# Patient Record
Sex: Female | Born: 1970 | Race: White | Hispanic: No | Marital: Married | Smoking: Current every day smoker
Health system: Southern US, Community
[De-identification: ages and names within clinical notes are randomized; demographics above are authoritative.]

## PROBLEM LIST (undated history)

## (undated) DIAGNOSIS — F419 Anxiety disorder, unspecified: Secondary | ICD-10-CM

## (undated) DIAGNOSIS — R569 Unspecified convulsions: Secondary | ICD-10-CM

## (undated) HISTORY — DX: Anxiety disorder, unspecified: F41.9

---

## 1999-11-12 ENCOUNTER — Inpatient Hospital Stay (HOSPITAL_COMMUNITY): Admission: EM | Admit: 1999-11-12 | Discharge: 1999-11-13 | Payer: Self-pay | Admitting: *Deleted

## 1999-11-12 ENCOUNTER — Encounter: Payer: Self-pay | Admitting: *Deleted

## 2001-03-11 ENCOUNTER — Emergency Department (HOSPITAL_COMMUNITY): Admission: EM | Admit: 2001-03-11 | Discharge: 2001-03-11 | Payer: Self-pay | Admitting: Emergency Medicine

## 2001-03-11 ENCOUNTER — Encounter: Payer: Self-pay | Admitting: Emergency Medicine

## 2001-05-04 ENCOUNTER — Ambulatory Visit (HOSPITAL_COMMUNITY): Admission: RE | Admit: 2001-05-04 | Discharge: 2001-05-04 | Payer: Self-pay | Admitting: *Deleted

## 2001-07-29 ENCOUNTER — Ambulatory Visit (HOSPITAL_COMMUNITY): Admission: RE | Admit: 2001-07-29 | Discharge: 2001-07-29 | Payer: Self-pay | Admitting: *Deleted

## 2001-08-26 ENCOUNTER — Ambulatory Visit (HOSPITAL_COMMUNITY): Admission: RE | Admit: 2001-08-26 | Discharge: 2001-08-26 | Payer: Self-pay | Admitting: *Deleted

## 2001-10-04 ENCOUNTER — Encounter (HOSPITAL_COMMUNITY): Admission: AD | Admit: 2001-10-04 | Discharge: 2001-10-04 | Payer: Self-pay | Admitting: *Deleted

## 2001-10-11 ENCOUNTER — Inpatient Hospital Stay (HOSPITAL_COMMUNITY): Admission: AD | Admit: 2001-10-11 | Discharge: 2001-10-14 | Payer: Self-pay | Admitting: *Deleted

## 2005-12-09 ENCOUNTER — Emergency Department (HOSPITAL_COMMUNITY): Admission: EM | Admit: 2005-12-09 | Discharge: 2005-12-10 | Payer: Self-pay | Admitting: Emergency Medicine

## 2013-06-09 ENCOUNTER — Encounter (HOSPITAL_COMMUNITY): Payer: Self-pay | Admitting: Emergency Medicine

## 2013-06-09 ENCOUNTER — Emergency Department (HOSPITAL_COMMUNITY): Payer: Self-pay

## 2013-06-09 ENCOUNTER — Emergency Department (HOSPITAL_COMMUNITY)
Admission: EM | Admit: 2013-06-09 | Discharge: 2013-06-09 | Disposition: A | Discharge status: Home / Self Care | Payer: Self-pay | Attending: Emergency Medicine | Admitting: Emergency Medicine

## 2013-06-09 DIAGNOSIS — Z880 Allergy status to penicillin: Secondary | ICD-10-CM | POA: Insufficient documentation

## 2013-06-09 DIAGNOSIS — N201 Calculus of ureter: Secondary | ICD-10-CM | POA: Insufficient documentation

## 2013-06-09 DIAGNOSIS — N898 Other specified noninflammatory disorders of vagina: Secondary | ICD-10-CM | POA: Insufficient documentation

## 2013-06-09 LAB — URINALYSIS, ROUTINE W REFLEX MICROSCOPIC
BILIRUBIN URINE: NEGATIVE
Glucose, UA: NEGATIVE mg/dL
KETONES UR: 15 mg/dL — AB
Leukocytes, UA: NEGATIVE
Nitrite: NEGATIVE
Protein, ur: NEGATIVE mg/dL
SPECIFIC GRAVITY, URINE: 1.025 (ref 1.005–1.030)
Urobilinogen, UA: 1 mg/dL (ref 0.0–1.0)
pH: 6.5 (ref 5.0–8.0)

## 2013-06-09 LAB — CBC WITH DIFFERENTIAL/PLATELET
Basophils Absolute: 0 10*3/uL (ref 0.0–0.1)
Basophils Relative: 0 % (ref 0–1)
EOS ABS: 0.2 10*3/uL (ref 0.0–0.7)
EOS PCT: 3 % (ref 0–5)
HCT: 41.5 % (ref 36.0–46.0)
HEMOGLOBIN: 14.2 g/dL (ref 12.0–15.0)
LYMPHS ABS: 1.7 10*3/uL (ref 0.7–4.0)
Lymphocytes Relative: 20 % (ref 12–46)
MCH: 31.3 pg (ref 26.0–34.0)
MCHC: 34.2 g/dL (ref 30.0–36.0)
MCV: 91.6 fL (ref 78.0–100.0)
MONOS PCT: 7 % (ref 3–12)
Monocytes Absolute: 0.6 10*3/uL (ref 0.1–1.0)
Neutro Abs: 6.1 10*3/uL (ref 1.7–7.7)
Neutrophils Relative %: 71 % (ref 43–77)
Platelets: 199 10*3/uL (ref 150–400)
RBC: 4.53 MIL/uL (ref 3.87–5.11)
RDW: 12.9 % (ref 11.5–15.5)
WBC: 8.6 10*3/uL (ref 4.0–10.5)

## 2013-06-09 LAB — URINE MICROSCOPIC-ADD ON

## 2013-06-09 LAB — WET PREP, GENITAL
Clue Cells Wet Prep HPF POC: NONE SEEN
TRICH WET PREP: NONE SEEN
Yeast Wet Prep HPF POC: NONE SEEN

## 2013-06-09 LAB — COMPREHENSIVE METABOLIC PANEL
ALT: 9 U/L (ref 0–35)
AST: 14 U/L (ref 0–37)
Albumin: 3.7 g/dL (ref 3.5–5.2)
Alkaline Phosphatase: 61 U/L (ref 39–117)
BUN: 12 mg/dL (ref 6–23)
CALCIUM: 8.9 mg/dL (ref 8.4–10.5)
CO2: 27 mEq/L (ref 19–32)
Chloride: 104 mEq/L (ref 96–112)
Creatinine, Ser: 0.58 mg/dL (ref 0.50–1.10)
GFR calc non Af Amer: >90 mL/min (ref >90)
GLUCOSE: 102 mg/dL — AB (ref 70–99)
Potassium: 4 mEq/L (ref 3.7–5.3)
Sodium: 143 mEq/L (ref 137–147)
TOTAL PROTEIN: 7 g/dL (ref 6.0–8.3)
Total Bilirubin: 0.5 mg/dL (ref 0.3–1.2)

## 2013-06-09 LAB — LIPASE, BLOOD: Lipase: 41 U/L (ref 11–59)

## 2013-06-09 LAB — HCG, SERUM, QUALITATIVE: PREG SERUM: NEGATIVE

## 2013-06-09 MED ORDER — KETOROLAC TROMETHAMINE 30 MG/ML IJ SOLN
30.0000 mg | Freq: Once | INTRAMUSCULAR | Status: AC
  Administered 2013-06-09: 30 mg via INTRAVENOUS
  Filled 2013-06-09: qty 1

## 2013-06-09 MED ORDER — ONDANSETRON HCL 4 MG/2ML IJ SOLN
4.0000 mg | Freq: Once | INTRAMUSCULAR | Status: AC
  Administered 2013-06-09: 4 mg via INTRAVENOUS
  Filled 2013-06-09: qty 2

## 2013-06-09 MED ORDER — AZITHROMYCIN 250 MG PO TABS
1000.0000 mg | ORAL_TABLET | Freq: Once | ORAL | Status: AC
  Administered 2013-06-09: 1000 mg via ORAL
  Filled 2013-06-09: qty 4

## 2013-06-09 MED ORDER — PROMETHAZINE HCL 25 MG PO TABS: 25.0000 mg | ORAL_TABLET | Freq: Four times a day (QID) | ORAL | Status: DC | PRN

## 2013-06-09 MED ORDER — CEFTRIAXONE SODIUM 250 MG IJ SOLR
250.0000 mg | Freq: Once | INTRAMUSCULAR | Status: AC
  Administered 2013-06-09: 250 mg via INTRAMUSCULAR
  Filled 2013-06-09: qty 250

## 2013-06-09 MED ORDER — HYDROMORPHONE HCL PF 1 MG/ML IJ SOLN
1.0000 mg | Freq: Once | INTRAMUSCULAR | Status: AC
  Administered 2013-06-09: 1 mg via INTRAVENOUS
  Filled 2013-06-09: qty 1

## 2013-06-09 MED ORDER — LORAZEPAM 2 MG/ML IJ SOLN
1.0000 mg | Freq: Once | INTRAMUSCULAR | Status: AC
  Administered 2013-06-09: 1 mg via INTRAVENOUS
  Filled 2013-06-09: qty 1

## 2013-06-09 MED ORDER — FENTANYL CITRATE 0.05 MG/ML IJ SOLN: 100.0000 ug | Freq: Once | INTRAMUSCULAR | Status: DC

## 2013-06-09 MED ORDER — OXYCODONE-ACETAMINOPHEN 5-325 MG PO TABS: 1.0000 | ORAL_TABLET | Freq: Four times a day (QID) | ORAL | Status: DC | PRN

## 2013-06-09 NOTE — ED Notes (Signed)
Patient transported from Ultrasound 

## 2013-06-09 NOTE — ED Notes (Signed)
Pt with acute onset RLQ pain.  Pt still has appendix.  Denies changes in bowel/bladder habits or vag discharge.

## 2013-06-09 NOTE — ED Provider Notes (Signed)
CSN: [id number]     Arrival date & time 06/09/13  0802 History   First MD Initiated Contact with Patient 06/09/13 [phone]     Chief Complaint  Patient presents with  . Abdominal Pain     (Consider location/radiation/quality/duration/timing/severity/associated sxs/prior Treatment) HPI Comments: Patient is a 43 year old female who presents today with severe, sudden onset right lower quadrant pain. She reports that the pain began at approximately 7 AM. It is a sharp pain that does not radiate. She ate breakfast prior to this pain beginning. She denies any nausea, vomiting, diarrhea, fevers, chills. She reports she has had an increase in clear vaginal discharge. She has had feelings of not emptying completely and urinary frequency. She denies any chance that she is pregnant. Last menstrual period was 05/22/2013. She has never had pain like this in the past. No prior hx of kidney stones.   Patient is a 43 y.o. female presenting with abdominal pain. The history is provided by the patient. No language interpreter was used.  Abdominal Pain Associated symptoms: vaginal discharge   Associated symptoms: no chest pain, no chills, no constipation, no diarrhea, no dysuria, no fever, no nausea, no shortness of breath, no vaginal bleeding and no vomiting     History reviewed. No pertinent past medical history. History reviewed. No pertinent past surgical history. No family history on file. History  Substance Use Topics  . Smoking status: Never Smoker   . Smokeless tobacco: Not on file  . Alcohol Use: No   OB History   Grav Para Term Preterm Abortions TAB SAB Ect Mult Living                 Review of Systems  Constitutional: Negative for fever and chills.  Respiratory: Negative for shortness of breath.   Cardiovascular: Negative for chest pain.  Gastrointestinal: Positive for abdominal pain. Negative for nausea, vomiting, diarrhea and constipation.  Genitourinary: Positive for frequency, decreased  urine volume, vaginal discharge and pelvic pain. Negative for dysuria, urgency and vaginal bleeding.  All other systems reviewed and are negative.      Allergies  Amoxicillin and Penicillins  Home Medications  No current outpatient prescriptions on file. BP 146/117  Pulse 85  Temp(Src) 98.4 F (36.9 C) (Oral)  Resp 18  SpO2 98%  LMP 05/22/2013 Physical Exam  Nursing note and vitals reviewed. Constitutional: She is oriented to person, place, and time. She appears well-developed and well-nourished. She does not appear ill. She appears distressed.  Patient is writhing around on bed, moaning. Patient is distractable.   HENT:  Head: Normocephalic and atraumatic.  Right Ear: External ear normal.  Left Ear: External ear normal.  Nose: Nose normal.  Mouth/Throat: Oropharynx is clear and moist.  Eyes: Conjunctivae are normal.  Neck: Normal range of motion.  Cardiovascular: Normal rate, regular rhythm and normal heart sounds.   Pulmonary/Chest: Effort normal and breath sounds normal. No stridor. No respiratory distress. She has no wheezes. She has no rales.  Abdominal: Soft. She exhibits no distension.    Genitourinary: There is no rash, tenderness, lesion or injury on the right labia. There is no rash, tenderness, lesion or injury on the left labia. Uterus is tender. Cervix exhibits no motion tenderness, no discharge and no friability. Right adnexum displays tenderness. Right adnexum displays no mass and no fullness. Left adnexum displays no mass, no tenderness and no fullness. No erythema, tenderness or bleeding around the vagina. No foreign body around the vagina. No signs of injury around  the vagina. No vaginal discharge found.  Musculoskeletal: Normal range of motion.  Neurological: She is alert and oriented to person, place, and time. She has normal strength.  Skin: Skin is warm and dry. She is not diaphoretic. No erythema.  Psychiatric: She has a normal mood and affect. Her  behavior is normal.    ED Course  Procedures (including critical care time) Labs Review Labs Reviewed  WET PREP, GENITAL - Abnormal; Notable for the following:    WBC, Wet Prep HPF POC TOO NUMEROUS TO COUNT (*)    All other components within normal limits  COMPREHENSIVE METABOLIC PANEL - Abnormal; Notable for the following:    Glucose, Bld 102 (*)    All other components within normal limits  URINALYSIS, ROUTINE W REFLEX MICROSCOPIC - Abnormal; Notable for the following:    APPearance CLOUDY (*)    Hgb urine dipstick LARGE (*)    Ketones, ur 15 (*)    All other components within normal limits  URINE MICROSCOPIC-ADD ON - Abnormal; Notable for the following:    Squamous Epithelial / LPF FEW (*)    All other components within normal limits  URINE CULTURE  GC/CHLAMYDIA PROBE AMP  CBC WITH DIFFERENTIAL  LIPASE, BLOOD  HCG, SERUM, QUALITATIVE   Imaging Review Ct Abdomen Pelvis Wo Contrast  06/09/2013   CLINICAL DATA:  Flank pain.  EXAM: CT ABDOMEN AND PELVIS WITHOUT CONTRAST  TECHNIQUE: Multidetector CT imaging of the abdomen and pelvis was performed following the standard protocol without intravenous contrast.  COMPARISON:  US PELVIS COMPLETE dated 06/09/2013  FINDINGS: Dependent subsegmental atelectasis in the lower lobes.  The patient moved throughout imaging, reducing diagnostic sensitivity and specificity. The patient was not able to position her arms above her head, and the limbs introduce some streak artifact.  Accounting for these factors, the liver, spleen, pancreas, and adrenal glands appear unremarkable on noncontrast imaging.  There is right hydronephrosis, perirenal stranding, and proximal hydroureter. There is likely distal hydroureter. There are some calcifications in the pelvis in the patient could well have a distal ureteral calculus, but images through the pelvis are severely blurred by motion artifact. Other causes of ureteral obstruction are not excluded.  The urinary  bladder is empty. Left kidney unremarkable. Segments of the appendix are thought to be visualized and appear unremarkable.  IMPRESSION: 1. Considerable right hydronephrosis and hydroureter extending down into the pelvis. There may well be a distal ureteral calculus, but the pelvis is obscured by severe motion artifact. The patient was not able to cooperate with imaging and there is extensive motion artifact on today's exam. 2. Segments of the appendix are visualized and believed to be normal. 3. Dependent subsegmental atelectasis in the lung bases.   Electronically Signed   By: Herbie Baltimore M.D.   On: 06/09/2013 13:31   US Transvaginal Non-ob  06/09/2013   CLINICAL DATA:  Acute right lower quadrant pain  EXAM: TRANSABDOMINAL AND TRANSVAGINAL ULTRASOUND OF PELVIS  DOPPLER ULTRASOUND OF OVARIES  TECHNIQUE: Both transabdominal and transvaginal ultrasound examinations of the pelvis were performed. Transabdominal technique was performed for global imaging of the pelvis including uterus, ovaries, adnexal regions, and pelvic cul-de-sac. Examination limited by patient motion. Patient unable to provide additional history.  It was necessary to proceed with endovaginal exam following the transabdominal exam to visualize the ovaries and endometrium. Color and duplex Doppler ultrasound was utilized to evaluate blood flow to the ovaries.  COMPARISON:  None  FINDINGS: Uterus  Measurements: 9.5 x 4.8 x  6.9 cm. Normal morphology without mass.  Endometrium  Thickness: 8 mm thick, normal. No endometrial fluid or focal abnormality.  Right ovary  Measurements: 2.5 x 1.5 x 1.2 cm. Normal morphology without mass. Internal blood flow present on color Doppler imaging.  Left ovary  Measurements: 3.1 x 2.3 x 2.7 cm. Dominant follicle cyst 1.8 x 1.5 x 1.8 cm. No additional mass. Internal blood flow present on color Doppler imaging.  Pulsed Doppler evaluation of both ovaries demonstrates normal low-resistance arterial and venous  waveforms.  Other findings  No free pelvic fluid. No adnexal masses. Appendix was not visualized nor expected to be visualized on this pelvic ultrasound exam.  IMPRESSION: No acute abnormalities.   Electronically Signed   By: Ulyses Southward M.D.   On: 06/09/2013 11:55   US Pelvis Complete  06/09/2013   CLINICAL DATA:  Acute right lower quadrant pain  EXAM: TRANSABDOMINAL AND TRANSVAGINAL ULTRASOUND OF PELVIS  DOPPLER ULTRASOUND OF OVARIES  TECHNIQUE: Both transabdominal and transvaginal ultrasound examinations of the pelvis were performed. Transabdominal technique was performed for global imaging of the pelvis including uterus, ovaries, adnexal regions, and pelvic cul-de-sac. Examination limited by patient motion. Patient unable to provide additional history.  It was necessary to proceed with endovaginal exam following the transabdominal exam to visualize the ovaries and endometrium. Color and duplex Doppler ultrasound was utilized to evaluate blood flow to the ovaries.  COMPARISON:  None  FINDINGS: Uterus  Measurements: 9.5 x 4.8 x 6.9 cm. Normal morphology without mass.  Endometrium  Thickness: 8 mm thick, normal. No endometrial fluid or focal abnormality.  Right ovary  Measurements: 2.5 x 1.5 x 1.2 cm. Normal morphology without mass. Internal blood flow present on color Doppler imaging.  Left ovary  Measurements: 3.1 x 2.3 x 2.7 cm. Dominant follicle cyst 1.8 x 1.5 x 1.8 cm. No additional mass. Internal blood flow present on color Doppler imaging.  Pulsed Doppler evaluation of both ovaries demonstrates normal low-resistance arterial and venous waveforms.  Other findings  No free pelvic fluid. No adnexal masses. Appendix was not visualized nor expected to be visualized on this pelvic ultrasound exam.  IMPRESSION: No acute abnormalities.   Electronically Signed   By: Ulyses Southward M.D.   On: 06/09/2013 11:55   [place] Art/ven Flow Abd Pelv Doppler  06/09/2013   CLINICAL DATA:  Acute right lower quadrant pain  EXAM:  TRANSABDOMINAL AND TRANSVAGINAL ULTRASOUND OF PELVIS  DOPPLER ULTRASOUND OF OVARIES  TECHNIQUE: Both transabdominal and transvaginal ultrasound examinations of the pelvis were performed. Transabdominal technique was performed for global imaging of the pelvis including uterus, ovaries, adnexal regions, and pelvic cul-de-sac. Examination limited by patient motion. Patient unable to provide additional history.  It was necessary to proceed with endovaginal exam following the transabdominal exam to visualize the ovaries and endometrium. Color and duplex Doppler ultrasound was utilized to evaluate blood flow to the ovaries.  COMPARISON:  None  FINDINGS: Uterus  Measurements: 9.5 x 4.8 x 6.9 cm. Normal morphology without mass.  Endometrium  Thickness: 8 mm thick, normal. No endometrial fluid or focal abnormality.  Right ovary  Measurements: 2.5 x 1.5 x 1.2 cm. Normal morphology without mass. Internal blood flow present on color Doppler imaging.  Left ovary  Measurements: 3.1 x 2.3 x 2.7 cm. Dominant follicle cyst 1.8 x 1.5 x 1.8 cm. No additional mass. Internal blood flow present on color Doppler imaging.  Pulsed Doppler evaluation of both ovaries demonstrates normal low-resistance arterial and venous waveforms.  Other findings  No free pelvic fluid. No adnexal masses. Appendix was not visualized nor expected to be visualized on this pelvic ultrasound exam.  IMPRESSION: No acute abnormalities.   Electronically Signed   By: Ulyses SouthwardMark  Boles M.D.   On: 06/09/2013 11:55    EKG Interpretation   None       MDM   Final diagnoses:  Ureteral calculus    Patient presents to ED with RLQ abdominal pain. She is exquisitely tender to palpation and distressed. Given the sudden onset of this pain an ultrasound was done to rule out ovarian torsion. US was unremarkable. Pelvic exam showed no CMT. Pt was given azithromycin and rocephin in ED for increased WBC seen on wet prep. CT scan was done which shows considerable right  hydronephrosis and hydroureter extending into the pelvis. Radiology was unable to comment on the size of the stone because the patient was moving through the exam. Patient is afebrile and there are no signs of infection in the patient's urine. Creatinine is not elevated. Given follow up with [facility] and encouraged to call for an appointment. Discussed reasons to return to the emergency department immediately. Discussed case with Dr. Redgie GrayerMasneri who agrees with plan. Vital signs stable for discharge. Patient / Family / Caregiver informed of clinical course, understand medical decision-making process, and agree with plan.     Mora BellmanHannah S Lyn Deemer, PA-C 06/09/13 1729

## 2013-06-09 NOTE — ED Notes (Signed)
Patient transported to CT 

## 2013-06-09 NOTE — ED Notes (Signed)
Patient transported to Ultrasound 

## 2013-06-09 NOTE — Discharge Instructions (Signed)
Diet for Kidney Stones °Kidney stones are small, hard masses that form inside your kidneys. They are made up of salts and minerals and often form when high levels build up in the urine. The minerals can then start to build up, crystalize, and stick together to form stones. There are several different types of kidney stones. The following types of stones may be influenced by dietary factors:  °· Calcium Oxalate Stones. An oxalate is a salt found in certain foods. Within the body, calcium can combine with oxalates to form calcium oxalate stones, which can be excreted in the urine in high amounts. This is the most common type of kidney stone. °· Calcium Phosphate Stones. These stones may occur when the pH of the urine becomes too high, or less acidic, from too much calcium being excreted in the urine. The pH is a measure of how acidic or basic a substance is. °· Uric Acid Stones. This type of stone occurs when the pH of the urine becomes too low, or very acidic, because substances called purines build up in the urine. Purines are found in animal proteins. When the urine is highly concentrated with acid, uric acid kidney stones can form.   °Other risk factors for kidney stones include genetics, environment, and being overweight. Your caregiver may ask you to follow specific diet guidelines based on the type of stone you have to lessen the chances of your body making more kidney stones.  °GENERAL GUIDELINES FOR ALL TYPES OF STONES °· Drink plenty of fluid. Drink 12 16 cups of fluid a day, drinking mainly water. This is the most important thing you can do to prevent the formation of future kidney stones. °· Maintain a healthy weight. Your caregiver or dietitian can help you determine what a healthy weight is for you. If you are overweight, weight loss may help prevent the formation of future kidney stones. °· Eat a diet adequate in animal protein. Too much animal protein can contribute to the formation of stones. Your  dietitian can help you determine how much protein you should be eating. Avoid low carbohydrate, high protein diets. °· Follow a balanced eating approach. The DASH diet, which stands for "Dietary Approaches to Stop Hypertension," is an effective meal plan for reducing stone formation. This diet is high in fruits, vegetables, dairy, and whole grains and low in animal protein. Ask your caregiver or dietitian for information about the DASH diet. °ADDITIONAL DIET GUIDELINES FOR CALCIUM STONES °Avoid foods high in salt. This includes table salt, salt seasonings, MSG, soy sauce, cured and processed meats, salted crackers and snack foods, fast food, and canned soups and foods. Ask your caregiver or dietitian for information about reducing sodium in your diet or following the low sodium diet.  °Ensure adequate calcium intake. Use the following table for calcium guidelines: °· Men 65 years old and younger  1000 mg/day. °· Men 65 years old and older  1500 mg/day. °· Women 25 41 years old  1000 mg/day. °· Women 50 years and older  1500 mg/day. °Your dietitian can help you determine if you are getting enough calcium in your diet. Foods that are high in calcium include dairy products, broccoli, cheese, yogurt, and pudding. If you need to take a calcium supplement, take it only in the form of calcium citrate.  °Avoid foods high in oxalate. Be sure that any supplements you take do not contain more than 500 mg of vitamin C. Vitamin C is converted into oxalate in the body. You do   not need to avoid fruits and vegetables high in vitamin C.   Grains: High-fiber or bran cereal, whole-wheat bread, grits, barley, buckwheat, amaranth, pretzels, and fruitcake.  Vegetables: Dried beans, wax beans, dark leafy greens, eggplant, leeks, okra, parsley, rutabaga, tomato paste, watercress, zucchini, and escarole.  Fruit: Dried apricots, red currants, figs, kiwi, and rhubarb.  Meat and Meat Substitutes: Soybeans and foods made from soy  (soyburger, miso), dried beans, peanut butter.  Milk: Chocolate milk mixes and soymilk.  Fats and Oils: Nuts (peanuts, almonds, pecans, cashews, hazelnuts) and nut butters, sesame seeds, and tDahini paste.  Condiments/Miscellaneous: Chocolate, carob, marmalade, poppy seeds, instant iced tea, and juice from high-oxalate fruits.  Document Released: 08/09/2010 Document Revised: 10/14/2011 Document Reviewed: 09/29/2011 [facility] Patient Information 2014 [place], [place].  Kidney Stones Kidney stones (urolithiasis) are solid masses that form inside your kidneys. The intense pain is caused by the stone moving through the kidney, ureter, bladder, and urethra (urinary tract). When the stone moves, the ureter starts to spasm around the stone. The stone is usually passed in your pee (urine).  HOME CARE  Drink enough fluids to keep your pee clear or pale yellow. This helps to get the stone out.  Strain all pee through the provided strainer. Do not pee without peeing through the strainer, not even once. If you pee the stone out, catch it in the strainer. The stone may be as small as a grain of salt. Take this to your doctor. This will help your doctor figure out what you can do to try to prevent more kidney stones.  Only take medicine as told by your doctor.  Follow up with your doctor as told.  Get follow-up X-rays as told by your doctor. GET HELP IF: You have pain that gets worse even if you have been taking pain medicine. GET HELP RIGHT AWAY IF:   Your pain does not get better with medicine.  You have a fever or shaking chills.  Your pain increases and gets worse over 18 hours.  You have new belly (abdominal) pain.  You feel faint or pass out.  You are unable to pee. MAKE SURE YOU:   Understand these instructions.  Will watch your condition.  Will get help right away if you are not doing well or get worse. Document Released: 10/01/2007 Document Revised: 12/15/2012 Document  Reviewed: 09/15/2012 [facility] Patient Information 2014 [place], [place].  Ureteral Colic (Kidney Stones) Ureteral colic is the result of a condition when kidney stones form inside the kidney. Once kidney stones are formed they may move into the tube that connects the kidney with the bladder (ureter). If this occurs, this condition may cause pain (colic) in the ureter.  CAUSES  Pain is caused by stone movement in the ureter and the obstruction caused by the stone. SYMPTOMS  The pain comes and goes as the ureter contracts around the stone. The pain is usually intense, sharp, and stabbing in character. The location of the pain may move as the stone moves through the ureter. When the stone is near the kidney the pain is usually located in the back and radiates to the belly (abdomen). When the stone is ready to pass into the bladder the pain is often located in the lower abdomen on the side the stone is located. At this location, the symptoms may mimic those of a urinary tract infection with urinary frequency. Once the stone is located here it often passes into the bladder and the pain disappears completely. TREATMENT  Your caregiver will provide you with medicine for pain relief.  You may require specialized follow-up X-rays.  The absence of pain does not always mean that the stone has passed. It may have just stopped moving. If the urine remains completely obstructed, it can cause loss of kidney function or even complete destruction of the involved kidney. It is your responsibility and in your interest that X-rays and follow-ups as suggested by your caregiver are completed. Relief of pain without passage of the stone can be associated with severe damage to the kidney, including loss of kidney function on that side.  If your stone does not pass on its own, additional measures may be taken by your caregiver to ensure its removal. HOME CARE INSTRUCTIONS   Increase your fluid intake. Water is the  preferred fluid since juices containing vitamin C may acidify the urine making it less likely for certain stones (uric acid stones) to pass.  Strain all urine. A strainer will be provided. Keep all particulate matter or stones for your caregiver to inspect.  Take your pain medicine as directed.  Make a follow-up appointment with your caregiver as directed.  Remember that the goal is passage of your stone. The absence of pain does not mean the stone is gone. Follow your caregiver's instructions.  Only take over-the-counter or prescription medicines for pain, discomfort, or fever as directed by your caregiver. SEEK MEDICAL CARE IF:   Pain cannot be controlled with the prescribed medicine.  You have a fever.  Pain continues for longer than your caregiver advises it should.  There is a change in the pain, and you develop chest discomfort or constant abdominal pain.  You feel faint or pass out. MAKE SURE YOU:   Understand these instructions.  Will watch your condition.  Will get help right away if you are not doing well or get worse. Document Released: 01/22/2005 Document Revised: 08/09/2012 Document Reviewed: 10/09/2010 [facility] Patient Information 2014 [place], [place].  Urine Strainer This strainer is used to catch or filter out any stones found in your urine. Place the strainer under your urine stream. Save any stones or objects that you find in your urine. Place them in a plastic or glass container to show your caregiver. The stones vary in size - some can be very small, so make sure you check the strainer carefully. Your caregiver may send the stone to the lab. When the results are back, your caregiver may recommend medicines or diet changes.  Document Released: 01/18/2004 Document Revised: 07/07/2011 Document Reviewed: 02/25/2008 [facility] Patient Information 2014 [place], [place].

## 2013-06-09 NOTE — ED Notes (Signed)
Notified Gavin PoundH. Merrell that US tech reports unable to perform US due to pt. Being to sleepy to answer questions.

## 2013-06-09 NOTE — ED Provider Notes (Signed)
Medical screening examination/treatment/procedure(s) were performed by non-physician practitioner and as supervising physician I was immediately available for consultation/collaboration.  EKG Interpretation   None         Darlys Galesavid Caylon Saine, MD 06/09/13 2023

## 2013-06-10 LAB — GC/CHLAMYDIA PROBE AMP
CT Probe RNA: NEGATIVE
GC Probe RNA: NEGATIVE

## 2013-06-11 LAB — URINE CULTURE

## 2014-12-25 ENCOUNTER — Encounter (HOSPITAL_COMMUNITY): Payer: Self-pay | Admitting: Emergency Medicine

## 2014-12-25 ENCOUNTER — Emergency Department (HOSPITAL_COMMUNITY): Payer: Medicaid Other

## 2014-12-25 ENCOUNTER — Inpatient Hospital Stay (HOSPITAL_COMMUNITY)
Admission: EM | Admit: 2014-12-25 | Discharge: 2014-12-27 | DRG: 101 | Disposition: A | Discharge status: Home / Self Care | Payer: Medicaid Other | Attending: Internal Medicine | Admitting: Internal Medicine

## 2014-12-25 DIAGNOSIS — R569 Unspecified convulsions: Secondary | ICD-10-CM

## 2014-12-25 DIAGNOSIS — G40909 Epilepsy, unspecified, not intractable, without status epilepticus: Secondary | ICD-10-CM

## 2014-12-25 DIAGNOSIS — G40201 Localization-related (focal) (partial) symptomatic epilepsy and epileptic syndromes with complex partial seizures, not intractable, with status epilepticus: Principal | ICD-10-CM | POA: Diagnosis present

## 2014-12-25 DIAGNOSIS — F410 Panic disorder [episodic paroxysmal anxiety] without agoraphobia: Secondary | ICD-10-CM | POA: Diagnosis present

## 2014-12-25 DIAGNOSIS — E876 Hypokalemia: Secondary | ICD-10-CM | POA: Diagnosis present

## 2014-12-25 DIAGNOSIS — Z880 Allergy status to penicillin: Secondary | ICD-10-CM

## 2014-12-25 DIAGNOSIS — G40901 Epilepsy, unspecified, not intractable, with status epilepticus: Secondary | ICD-10-CM

## 2014-12-25 LAB — COMPREHENSIVE METABOLIC PANEL
ALBUMIN: 3.4 g/dL — AB (ref 3.5–5.0)
ALT: 17 U/L (ref 14–54)
AST: 29 U/L (ref 15–41)
Alkaline Phosphatase: 46 U/L (ref 38–126)
Anion gap: 10 (ref 5–15)
BILIRUBIN TOTAL: 0.5 mg/dL (ref 0.3–1.2)
BUN: 12 mg/dL (ref 6–20)
CO2: 20 mmol/L — ABNORMAL LOW (ref 22–32)
CREATININE: 0.76 mg/dL (ref 0.44–1.00)
Calcium: 8.5 mg/dL — ABNORMAL LOW (ref 8.9–10.3)
Chloride: 108 mmol/L (ref 101–111)
GFR calc Af Amer: >60 mL/min (ref >60)
Glucose, Bld: 150 mg/dL — ABNORMAL HIGH (ref 65–99)
Potassium: 3.7 mmol/L (ref 3.5–5.1)
SODIUM: 138 mmol/L (ref 135–145)
TOTAL PROTEIN: 6.2 g/dL — AB (ref 6.5–8.1)

## 2014-12-25 LAB — ACETAMINOPHEN LEVEL: Acetaminophen (Tylenol), Serum: <10 ug/mL — ABNORMAL LOW (ref 10–30)

## 2014-12-25 LAB — RAPID URINE DRUG SCREEN, HOSP PERFORMED
AMPHETAMINES: NOT DETECTED
BARBITURATES: NOT DETECTED
BENZODIAZEPINES: POSITIVE — AB
Cocaine: NOT DETECTED
Opiates: NOT DETECTED
Tetrahydrocannabinol: NOT DETECTED

## 2014-12-25 LAB — CBC
HCT: 38.1 % (ref 36.0–46.0)
Hemoglobin: 12.7 g/dL (ref 12.0–15.0)
MCH: 30.9 pg (ref 26.0–34.0)
MCHC: 33.3 g/dL (ref 30.0–36.0)
MCV: 92.7 fL (ref 78.0–100.0)
PLATELETS: DECREASED 10*3/uL (ref 150–400)
RBC: 4.11 MIL/uL (ref 3.87–5.11)
RDW: 13.1 % (ref 11.5–15.5)
WBC: 5.4 10*3/uL (ref 4.0–10.5)

## 2014-12-25 LAB — ETHANOL: Alcohol, Ethyl (B): <5 mg/dL (ref <5)

## 2014-12-25 LAB — PREGNANCY, URINE: Preg Test, Ur: NEGATIVE

## 2014-12-25 LAB — CBG MONITORING, ED: GLUCOSE-CAPILLARY: 164 mg/dL — AB (ref 65–99)

## 2014-12-25 LAB — I-STAT CG4 LACTIC ACID, ED
LACTIC ACID, VENOUS: 0.56 mmol/L (ref 0.5–2.0)
Lactic Acid, Venous: 4.58 mmol/L (ref 0.5–2.0)

## 2014-12-25 LAB — POC URINE PREG, ED: PREG TEST UR: NEGATIVE

## 2014-12-25 LAB — SALICYLATE LEVEL: Salicylate Lvl: <4 mg/dL (ref 2.8–30.0)

## 2014-12-25 MED ORDER — SODIUM CHLORIDE 0.9 % IV SOLN
1000.0000 mg | Freq: Once | INTRAVENOUS | Status: AC
  Administered 2014-12-25: 1000 mg via INTRAVENOUS
  Filled 2014-12-25: qty 10

## 2014-12-25 NOTE — ED Notes (Signed)
Pt more alert at this time, following commands, very emotional due to don't understanding what happen that she is on the hospital. Family member at the bedside, NAD noticed.

## 2014-12-25 NOTE — ED Notes (Signed)
Pt brought to ED by [facility] from home for seizures activity, family originally called for panic attack, pt had 4 episodes of seizures lasting for 3 min on [facility] arrival, no hx of previous seizures, pt post ictal all the way to the ED and now. Pt is on a NRM, no alert at this time. 5 mg of Versed given by [facility]. BP 120/74, HR 110, R-19, ST on [facility] EKG.

## 2014-12-25 NOTE — ED Notes (Signed)
Nanavati at the bedside.

## 2014-12-25 NOTE — ED Notes (Signed)
Pt returned from CT.  Unable to obtain scan due to pt not being able to cooperate.  Pt pulling off gown, attempting to pull out IV.  Seizure pads remain in place.  Family at bedside.

## 2014-12-25 NOTE — ED Provider Notes (Signed)
CSN: [id number]     Arrival date & time 12/25/14  1916 History   First MD Initiated Contact with Patient 12/25/14 1936     Chief Complaint  Patient presents with  . Seizures     (Consider location/radiation/quality/duration/timing/severity/associated sxs/prior Treatment) Patient is a 44 y.o. female presenting with seizures.  Seizures Seizure activity on arrival: no   Seizure type:  Grand mal Preceding symptoms: headache   Initial focality:  Diffuse Episode characteristics: generalized shaking   Postictal symptoms: confusion and somnolence   Return to baseline: no   Severity:  Severe Duration:  30 minutes Timing:  Unable to specify Progression:  Unable to specify Context: medical compliance and not previous head injury   Recent head injury:  No recent head injuries PTA treatment:  None History of seizures: no     History reviewed. No pertinent past medical history. History reviewed. No pertinent past surgical history. History reviewed. No pertinent family history. Social History  Substance Use Topics  . Smoking status: Never Smoker   . Smokeless tobacco: None  . Alcohol Use: No   OB History    No data available     Review of Systems  Unable to perform ROS: Acuity of condition  Neurological: Positive for seizures.      Allergies  Amoxicillin and Penicillins  Home Medications   Prior to Admission medications   Medication Sig Start Date End Date Taking? Authorizing Provider  oxyCODONE-acetaminophen (PERCOCET/ROXICET) 5-325 MG per tablet Take 1-2 tablets by mouth every 6 (six) hours as needed for severe pain. Patient not taking: Reported on 12/25/2014 06/09/13   Junious Silk, PA-C  promethazine (PHENERGAN) 25 MG tablet Take 1 tablet (25 mg total) by mouth every 6 (six) hours as needed for nausea or vomiting. Patient not taking: Reported on 12/25/2014 06/09/13   Junious Silk, PA-C   BP 107/59 mmHg  Pulse 87  Temp(Src) 99.5 F (37.5 C)  Resp 18  Ht 5\' 6"   (1.676 m)  Wt 170 lb (77.111 kg)  BMI 27.45 kg/m2  SpO2 99% Physical Exam  Constitutional: She appears well-developed and well-nourished. She appears distressed.  HENT:  Head: Normocephalic and atraumatic.  Eyes: Pupils are equal, round, and reactive to light.  Cardiovascular: Regular rhythm and normal heart sounds.  Tachycardia present.   No murmur heard. Pulmonary/Chest: Effort normal and breath sounds normal. No stridor. No respiratory distress. She has no wheezes.  Abdominal: Soft. There is no tenderness.  Musculoskeletal: She exhibits no edema.  Neurological: GCS eye subscore is 1. GCS verbal subscore is 1. GCS motor subscore is 5.  Skin: She is not diaphoretic.    ED Course  Procedures (including critical care time) Labs Review Labs Reviewed  ACETAMINOPHEN LEVEL - Abnormal; Notable for the following:    Acetaminophen (Tylenol), Serum <10 (*)    All other components within normal limits  URINE RAPID DRUG SCREEN, HOSP PERFORMED - Abnormal; Notable for the following:    Benzodiazepines POSITIVE (*)    All other components within normal limits  COMPREHENSIVE METABOLIC PANEL - Abnormal; Notable for the following:    CO2 20 (*)    Glucose, Bld 150 (*)    Calcium 8.5 (*)    Total Protein 6.2 (*)    Albumin 3.4 (*)    All other components within normal limits  CBG MONITORING, ED - Abnormal; Notable for the following:    Glucose-Capillary 164 (*)    All other components within normal limits  I-STAT CG4 LACTIC ACID, ED -  Abnormal; Notable for the following:    Lactic Acid, Venous 4.58 (*)    All other components within normal limits  ETHANOL  SALICYLATE LEVEL  CBC  PREGNANCY, URINE  POC URINE PREG, ED  I-STAT CG4 LACTIC ACID, ED    Imaging Review Ct Head Wo Contrast  12/25/2014   CLINICAL DATA:  New onset seizure, lasted for 3 minutes  EXAM: CT HEAD WITHOUT CONTRAST  TECHNIQUE: Contiguous axial images were obtained from the base of the skull through the vertex without  intravenous contrast.  COMPARISON:  None.  FINDINGS: Mild asymmetry in the lateral ventricles within normal limits of variability. Normal sulcation. Minimal calcification of the bilateral basal ganglia. No evidence of hemorrhage or mass no evidence of infarct or extra-axial fluid. Calvarium is intact. Visualized portions of the paranasal sinuses are clear.  IMPRESSION: No significant abnormalities   Electronically Signed   By: Esperanza Heir M.D.   On: 12/25/2014 21:52   Dg Chest Portable 1 View  12/25/2014   CLINICAL DATA:  Seizure  EXAM: PORTABLE CHEST - 1 VIEW  COMPARISON:  None.  FINDINGS: Lungs are hypoaerated with crowding of the bronchovascular markings. Leads obscure detail. Heart size is normal. No pleural effusion. No pneumothorax. No acute osseous finding.  IMPRESSION: Low volume exam with crowding of the bronchovascular markings at the bases but no focal acute finding.   Electronically Signed   By: Christiana Pellant M.D.   On: 12/25/2014 20:06   I have personally reviewed and evaluated these images and lab results as part of my medical decision-making.   EKG Interpretation   Date/Time:  Monday December 25 2014 19:23:53 EDT Ventricular Rate:  110 PR Interval:  143 QRS Duration: 86 QT Interval:  348 QTC Calculation: 471 R Axis:   -23 Text Interpretation:  Sinus tachycardia Borderline left axis deviation No  acute changes ST depression lateral leads No old tracing to compare  Confirmed by Rhunette Croft, MD, Janey Genta [phone]) on 12/25/2014 9:03:51 PM      MDM   Final diagnoses:  Seizure     Patient is a 44 year old female that presents after seizure. Patient spoke with her mother earlier in the day and stated she was having headache. Patient's husband found her unresponsive and then noticed generalized clonic tonic seizure activity. EMS arrived and the patient was seizing they gave him 5 of Versed which broke the seizure. Upon arrival to the ED the patient is postictal very combative and  unwilling to follow commands. That time patient's GCS was 7. Patient elevated lactic acid as well. Given no known history of seizure full workup was done to include head CT. Workup was unremarkable and lactate cleared. Patient took approximately 4 hours to come back to baseline. Given status epilepticus patient will be admitted to hospitalist. Neurology was called and they recommended 1 g of Keppra load and after admission patient is to be discharged with 500 mg of Keppra twice a day.    Beverely Risen, MD 12/26/14 7253  Derwood Kaplan, MD 12/26/14 6644

## 2014-12-26 ENCOUNTER — Inpatient Hospital Stay (HOSPITAL_COMMUNITY): Payer: Medicaid Other

## 2014-12-26 DIAGNOSIS — Z880 Allergy status to penicillin: Secondary | ICD-10-CM | POA: Diagnosis not present

## 2014-12-26 DIAGNOSIS — R569 Unspecified convulsions: Secondary | ICD-10-CM | POA: Diagnosis present

## 2014-12-26 DIAGNOSIS — G40909 Epilepsy, unspecified, not intractable, without status epilepticus: Secondary | ICD-10-CM

## 2014-12-26 DIAGNOSIS — G40201 Localization-related (focal) (partial) symptomatic epilepsy and epileptic syndromes with complex partial seizures, not intractable, with status epilepticus: Secondary | ICD-10-CM | POA: Diagnosis present

## 2014-12-26 DIAGNOSIS — E876 Hypokalemia: Secondary | ICD-10-CM | POA: Diagnosis present

## 2014-12-26 DIAGNOSIS — G40301 Generalized idiopathic epilepsy and epileptic syndromes, not intractable, with status epilepticus: Secondary | ICD-10-CM

## 2014-12-26 DIAGNOSIS — F410 Panic disorder [episodic paroxysmal anxiety] without agoraphobia: Secondary | ICD-10-CM | POA: Diagnosis present

## 2014-12-26 LAB — CBC WITH DIFFERENTIAL/PLATELET
BASOS PCT: 0 % (ref 0–1)
Basophils Absolute: 0 10*3/uL (ref 0.0–0.1)
EOS ABS: 0 10*3/uL (ref 0.0–0.7)
Eosinophils Relative: 0 % (ref 0–5)
HCT: 34.5 % — ABNORMAL LOW (ref 36.0–46.0)
HEMOGLOBIN: 11.5 g/dL — AB (ref 12.0–15.0)
Lymphocytes Relative: 14 % (ref 12–46)
Lymphs Abs: 1.3 10*3/uL (ref 0.7–4.0)
MCH: 30.7 pg (ref 26.0–34.0)
MCHC: 33.3 g/dL (ref 30.0–36.0)
MCV: 92 fL (ref 78.0–100.0)
MONOS PCT: 9 % (ref 3–12)
Monocytes Absolute: 0.9 10*3/uL (ref 0.1–1.0)
NEUTROS PCT: 77 % (ref 43–77)
Neutro Abs: 7.6 10*3/uL (ref 1.7–7.7)
Platelets: 124 10*3/uL — ABNORMAL LOW (ref 150–400)
RBC: 3.75 MIL/uL — AB (ref 3.87–5.11)
RDW: 13.1 % (ref 11.5–15.5)
WBC: 9.9 10*3/uL (ref 4.0–10.5)

## 2014-12-26 LAB — BASIC METABOLIC PANEL
ANION GAP: 4 — AB (ref 5–15)
BUN: 6 mg/dL (ref 6–20)
CHLORIDE: 113 mmol/L — AB (ref 101–111)
CO2: 25 mmol/L (ref 22–32)
Calcium: 8.3 mg/dL — ABNORMAL LOW (ref 8.9–10.3)
Creatinine, Ser: 0.56 mg/dL (ref 0.44–1.00)
GFR calc non Af Amer: >60 mL/min (ref >60)
GLUCOSE: 109 mg/dL — AB (ref 65–99)
POTASSIUM: 3.7 mmol/L (ref 3.5–5.1)
Sodium: 142 mmol/L (ref 135–145)

## 2014-12-26 LAB — COMPREHENSIVE METABOLIC PANEL
ALBUMIN: 3.1 g/dL — AB (ref 3.5–5.0)
ALK PHOS: 42 U/L (ref 38–126)
ALT: 15 U/L (ref 14–54)
ANION GAP: 6 (ref 5–15)
AST: 21 U/L (ref 15–41)
BUN: 8 mg/dL (ref 6–20)
CALCIUM: 7.8 mg/dL — AB (ref 8.9–10.3)
CO2: 22 mmol/L (ref 22–32)
Chloride: 109 mmol/L (ref 101–111)
Creatinine, Ser: 0.6 mg/dL (ref 0.44–1.00)
GFR calc Af Amer: >60 mL/min (ref >60)
GFR calc non Af Amer: >60 mL/min (ref >60)
GLUCOSE: 96 mg/dL (ref 65–99)
Potassium: 2.8 mmol/L — ABNORMAL LOW (ref 3.5–5.1)
SODIUM: 137 mmol/L (ref 135–145)
Total Bilirubin: 0.9 mg/dL (ref 0.3–1.2)
Total Protein: 5.2 g/dL — ABNORMAL LOW (ref 6.5–8.1)

## 2014-12-26 LAB — MAGNESIUM: Magnesium: 2 mg/dL (ref 1.7–2.4)

## 2014-12-26 MED ORDER — ACETAMINOPHEN 325 MG PO TABS: 650.0000 mg | ORAL_TABLET | ORAL | Status: DC | PRN

## 2014-12-26 MED ORDER — ENOXAPARIN SODIUM 40 MG/0.4ML ~~LOC~~ SOLN
40.0000 mg | SUBCUTANEOUS | Status: DC
  Administered 2014-12-26: 40 mg via SUBCUTANEOUS
  Filled 2014-12-26: qty 0.4

## 2014-12-26 MED ORDER — ONDANSETRON HCL 4 MG/2ML IJ SOLN: 4.0000 mg | Freq: Four times a day (QID) | INTRAMUSCULAR | Status: DC | PRN

## 2014-12-26 MED ORDER — POTASSIUM CHLORIDE CRYS ER 20 MEQ PO TBCR
40.0000 meq | EXTENDED_RELEASE_TABLET | Freq: Once | ORAL | Status: AC
  Administered 2014-12-26: 40 meq via ORAL
  Filled 2014-12-26: qty 2

## 2014-12-26 MED ORDER — SODIUM CHLORIDE 0.9 % IV SOLN
75.0000 mL/h | INTRAVENOUS | Status: DC
  Administered 2014-12-26: 75 mL/h via INTRAVENOUS

## 2014-12-26 MED ORDER — POTASSIUM CHLORIDE 10 MEQ/100ML IV SOLN
10.0000 meq | INTRAVENOUS | Status: AC
  Administered 2014-12-26 (×2): 10 meq via INTRAVENOUS
  Filled 2014-12-26 (×2): qty 100

## 2014-12-26 MED ORDER — SODIUM CHLORIDE 0.9 % IV SOLN
500.0000 mg | Freq: Two times a day (BID) | INTRAVENOUS | Status: DC
  Administered 2014-12-26 (×2): 500 mg via INTRAVENOUS
  Filled 2014-12-26 (×4): qty 5

## 2014-12-26 MED ORDER — GADOBENATE DIMEGLUMINE 529 MG/ML IV SOLN
15.0000 mL | Freq: Once | INTRAVENOUS | Status: AC | PRN
  Administered 2014-12-26: 15 mL via INTRAVENOUS

## 2014-12-26 MED ORDER — LORAZEPAM 2 MG/ML IJ SOLN: 1.0000 mg | INTRAMUSCULAR | Status: DC | PRN

## 2014-12-26 MED ORDER — ONDANSETRON HCL 4 MG PO TABS: 4.0000 mg | ORAL_TABLET | Freq: Four times a day (QID) | ORAL | Status: DC | PRN

## 2014-12-26 MED ORDER — ACETAMINOPHEN 650 MG RE SUPP: 650.0000 mg | RECTAL | Status: DC | PRN

## 2014-12-26 MED ORDER — POTASSIUM CHLORIDE CRYS ER 20 MEQ PO TBCR
30.0000 meq | EXTENDED_RELEASE_TABLET | Freq: Once | ORAL | Status: AC
  Administered 2014-12-26: 30 meq via ORAL
  Filled 2014-12-26 (×2): qty 1

## 2014-12-26 NOTE — Care Management Note (Signed)
Case Management Note  Patient Details  Name: SHARAYA BORUFF MRN: [id number] Date of Birth: 10-19-1970  Subjective/Objective:                 Spoke with patient and husband at bedside. Asked for help with obtaining insurance. Left message with Marcene Corning, financial counselor, and provided patient and husband with contact number. Patient received pamphlet from [facility] and [facility]. CM explained to patient that they may use the on site pharmacy to fill prescriptions given to them at discharge. Patient aware that the [facility] pharmacy will not fill narcotics or pain medications prior to the patient being seen by one of their physicians.  Patient aware that they must be seen as a patient prior to the pharmacy filling the prescriptions a second time.   Action/Plan:  DC to home with follow up at [facility].  Expected Discharge Date:  12/27/14               Expected Discharge Plan:  Home/Self Care  In-House Referral:     Discharge planning Services  CM Consult  Post Acute Care Choice:    Choice offered to:     DME Arranged:    DME Agency:     HH Arranged:    HH Agency:     Status of Service:  In process, will continue to follow  Medicare Important Message Given:    Date Medicare IM Given:    Medicare IM give by:    Date Additional Medicare IM Given:    Additional Medicare Important Message give by:     If discussed at Long Length of Stay Meetings, dates discussed:    Additional Comments:  Lawerance Sabal, RN 12/26/2014, 12:14 PM

## 2014-12-26 NOTE — ED Notes (Signed)
hospitalist at the bedside 

## 2014-12-26 NOTE — Progress Notes (Signed)
EEG Completed; Results Pending  

## 2014-12-26 NOTE — Progress Notes (Signed)
Admission Note  Patient arrived to floor in room 5W16 via bed from ED. Patient alert and oriented X 4. Vitals signs  Oral temperature 99.2 F       Blood pressure 103/62       Pulse 71       RR 18       SpO2 100 % on room air. Denies pain. Skin intact, no pressure ulcer noted in sacral area. Telemetry monitor # 16 with continuous pulse oximetry placed  Patient's ID armband verified with patient/ family, and in place. Information packet given to patient/ family. Fall risk assessed, SR up X2, patient/ family able to verbalize understanding of risks associated with falls and to call nurse or staff to assist before getting out of bed. Patient/ family oriented to room and equipment. Call bell within reach.

## 2014-12-26 NOTE — ED Notes (Signed)
Report attempted, RN to call back. 

## 2014-12-26 NOTE — Procedures (Signed)
ELECTROENCEPHALOGRAM REPORT   Patient: Tracy Kelly       Room #: 5A21 EEG No. ID: [id number] Age: 44 y.o.        Sex: female Referring Physician: Ghimire Report Date:  12/26/2014        Interpreting Physician: Thana Farr  History: [name] is an 44 y.o. female presenting after multiple seizures.  No previous history of seizures.    Medications:  Scheduled: . enoxaparin (LOVENOX) injection  40 mg Subcutaneous Q24H  . levETIRAcetam  500 mg Intravenous Q12H    Conditions of Recording:  This is a 16 channel EEG carried out with the patient in the awake, drowsy and asleep state.  Description:  The waking background activity consists of a low voltage, symmetrical, fairly well organized, 9 Hz alpha activity, seen from the parieto-occipital and posterior temporal regions.  Low voltage fast activity, poorly organized, is seen anteriorly and is at times superimposed on more posterior regions.  A mixture of theta and alpha rhythms are seen from the central and temporal regions. The patient drowses with slowing to irregular, low voltage theta and beta activity.   The patient goes in to a light sleep with symmetrical sleep spindles, vertex central sharp transients and irregular slow activity.   Hyperventilation produced a mild to moderate buildup but failed to elicit any abnormalities.  Intermittent photic stimulation was performed but failed to illicit any change in the tracing.    IMPRESSION: Normal electroencephalogram, awake, asleep and with activation procedures. There are no focal lateralizing or epileptiform features.   Thana Farr, MD [facility] [phone] 12/26/2014, 10:19 AM

## 2014-12-26 NOTE — Progress Notes (Signed)
Craige Cotta NP paged and notified about patient's Potassium of 2.8.

## 2014-12-26 NOTE — H&P (Signed)
[facility] History and Physical  Patient: Tracy Kelly  MRN: [id number]  DOB: 09-16-1970  DOS: the patient was seen and examined on 12/26/2014 PCP: No PCP Per Patient  Referring physician:  Dr. Rhunette Croft Chief Complaint:  seizures  HPI: Tracy Kelly is a 44 y.o. female with Past medical history of  Panic attacks.  the patient was brought in by EMS with the complaints of seizure.  The history was taken from husband was at bedside. The husband mentions she has known her for more than 10 years and she has recurrent episodes of panic attacks during which time she would be staring without any appropriate counselor momentarily and she would not have any recollection of those episodes.  There won't be any tonic-clonic movement of body or tremor like activity or loss of control of bowel or bladder.  she also has not have any fall trauma or injury.  The patient husband mentions that she has been having more frequent panic attacks and they're lasting longer then her usual.  Earlier in the morning she had 2 episodes of similar attacks. Later on when the husband returned from work he found that she was sitting on the sofa not responding to any verbal stimuli. Later on the patient started answering questions only in simple yes or no.  She had a couple of episodes off her giving the stair without any verbal stimuli and at which point the husband decided to take the patient Hospital.  EMS was called and while the EMS was prepping the patient and evaluating the patient started having seizure-like episodes.  EMS reported that the patient had 2 episodes at home and 3 episodes on route of seizure, generalized tonic-clonic associated with urinary incontinence. Although she did not have any tongue bite   husband denies patient taking any medications over-the-counter or prescription. Patient denies any alcohol or drug abuse.  No fall no trauma no injury. Patient did not have any complaints of  headache dizziness lightheadedness nausea vomiting vision changes focal deficit diarrhea constipation burning urination.  The patient is coming from home.  At her baseline ambulates  Without any support  Review of Systems: as mentioned in the history of present illness.  A comprehensive review of the other systems is negative.  History reviewed. No pertinent past medical history. History reviewed. No pertinent past surgical history. Social History:  reports that she has never smoked. She does not have any smokeless tobacco history on file. She reports that she does not drink alcohol or use illicit drugs.  Allergies  Allergen Reactions  . Amoxicillin     hives  . Penicillins     hives    History reviewed. No pertinent family history.  Prior to Admission medications   Medication Sig Start Date End Date Taking? Authorizing Provider  oxyCODONE-acetaminophen (PERCOCET/ROXICET) 5-325 MG per tablet Take 1-2 tablets by mouth every 6 (six) hours as needed for severe pain. Patient not taking: Reported on 12/25/2014 06/09/13   Junious Silk, PA-C  promethazine (PHENERGAN) 25 MG tablet Take 1 tablet (25 mg total) by mouth every 6 (six) hours as needed for nausea or vomiting. Patient not taking: Reported on 12/25/2014 06/09/13   Junious Silk, PA-C    Physical Exam: Filed Vitals:   12/26/14 0030 12/26/14 0045 12/26/14 0114 12/26/14 0201  BP: 113/70 107/58 103/62   Pulse:   90   Temp:   99.2 F (37.3 C)   TempSrc:   Oral   Resp: 22 18 18  Height:    5\' 6"  (1.676 m)  Weight:    77.248 kg (170 lb 4.8 oz)  SpO2:   100%     General: Alert, Awake and Oriented to Time, Place and Person. Appear in mild distress Eyes: PERRL ENT: Oral Mucosa clear moist. Neck: non JVD Cardiovascular: S1 and S2 Present, no Murmur, Peripheral Pulses Present Respiratory: Bilateral Air entry equal and Decreased,  Clear to Auscultation, no Crackles, no wheezes Abdomen: Bowel Sound present, Soft and non  tenderness Skin: no Rash Extremities: no Pedal edema, no calf tenderness Neurologic: Grossly no focal neuro deficit other than generalized weakness. Labs on Admission:  CBC:  Recent Labs Lab 12/25/14 1946 12/26/14 0310  WBC 5.4 9.9  NEUTROABS  --  7.6  HGB 12.7 11.5*  HCT 38.1 34.5*  MCV 92.7 92.0  PLT PLATELET CLUMPS NOTED ON SMEAR, COUNT APPEARS DECREASED 124*    CMP     Component Value Date/Time   NA 137 12/26/2014 0310   K 2.8* 12/26/2014 0310   CL 109 12/26/2014 0310   CO2 22 12/26/2014 0310   GLUCOSE 96 12/26/2014 0310   BUN 8 12/26/2014 0310   CREATININE 0.60 12/26/2014 0310   CALCIUM 7.8* 12/26/2014 0310   PROT 5.2* 12/26/2014 0310   ALBUMIN 3.1* 12/26/2014 0310   AST 21 12/26/2014 0310   ALT 15 12/26/2014 0310   ALKPHOS 42 12/26/2014 0310   BILITOT 0.9 12/26/2014 0310   GFRNONAA >60 12/26/2014 0310   GFRAA >60 12/26/2014 0310    No results for input(s): LIPASE, AMYLASE in the last 168 hours.  No results for input(s): CKTOTAL, CKMB, CKMBINDEX, TROPONINI in the last 168 hours. BNP (last 3 results) No results for input(s): BNP in the last 8760 hours.  ProBNP (last 3 results) No results for input(s): PROBNP in the last 8760 hours.   Radiological Exams on Admission: Ct Head Wo Contrast  12/25/2014   CLINICAL DATA:  New onset seizure, lasted for 3 minutes  EXAM: CT HEAD WITHOUT CONTRAST  TECHNIQUE: Contiguous axial images were obtained from the base of the skull through the vertex without intravenous contrast.  COMPARISON:  None.  FINDINGS: Mild asymmetry in the lateral ventricles within normal limits of variability. Normal sulcation. Minimal calcification of the bilateral basal ganglia. No evidence of hemorrhage or mass no evidence of infarct or extra-axial fluid. Calvarium is intact. Visualized portions of the paranasal sinuses are clear.  IMPRESSION: No significant abnormalities   Electronically Signed   By: Esperanza Heir M.D.   On: 12/25/2014 21:52   Dg  Chest Portable 1 View  12/25/2014   CLINICAL DATA:  Seizure  EXAM: PORTABLE CHEST - 1 VIEW  COMPARISON:  None.  FINDINGS: Lungs are hypoaerated with crowding of the bronchovascular markings. Leads obscure detail. Heart size is normal. No pleural effusion. No pneumothorax. No acute osseous finding.  IMPRESSION: Low volume exam with crowding of the bronchovascular markings at the bases but no focal acute finding.   Electronically Signed   By: Christiana Pellant M.D.   On: 12/25/2014 20:06   EKG sinus tachycardia  Assessment/Plan Principal Problem:   Seizure disorder Active Problems:   Hypokalemia   1. Seizure disorder  the patient is presenting with complaints of a seizure-like episode. She was also seen to have panicattack-like episodes,  Which sounds like probable partial seizure.  she did have elevated lactic acid on arrival as well as urinary incontinence on arrival and remained postictal for 3-4 hours. Patient did not have  any seizure-like episode here and currently is appearing to be at her baseline.  her CT scan was unremarkable at rest of the blood work was also unremarkable.  with this the patient will be admitted in the hospital. She will be monitored on telemetry. We will use when necessary lorazepam as well as seizure prophylaxis. The patient has been loaded with Keppra 1 g and we will continue her on 500 mg every 12 hours of Keppra initially IV.  EEG in the morning as well as MRI in the morning is ordered per discussion with the neurology.  we will follow the workup.  2. Hypokalemia. Currently replacing.  Advance goals of care discussion:  Full code   Consults:  Neurology appreciate input  DVT Prophylaxis: subcutaneous Heparin Nutrition:  Advance as tolerated  Family Communication: family was present at bedside, opportunity was given to ask question and all questions were answered satisfactorily at the time of interview. Disposition: Admitted as inpatient, telemetry  unit.  Author: Lynden Oxford, MD [facility] Pager: [phone] 12/26/2014  If 7PM-7AM, please contact night-coverage [email or web address] Password [email or web address]

## 2014-12-26 NOTE — Evaluation (Signed)
Physical Therapy Evaluation Patient Details Name: Tracy Kelly MRN: [id number] DOB: 1970/08/11 Today's Date: 12/26/2014   History of Present Illness  44 y.o. female admitted for Seizure disorder and hypokalemia.  Clinical Impression  Pt admitted with the above complications. Pt currently with functional limitations due to the deficits listed below (see PT Problem List). Ambulates majority of bout without difficulty however when positional head changes and variable gait speeds introduced patient had loss of balance on 2 occasions requiring assist. Anticipate she will progress quickly and become more stable/safe as she becomes more mobile. Pt will benefit from skilled PT to increase their independence and safety with mobility to allow discharge to the venue listed below.       Follow Up Recommendations Outpatient PT (If instability with gait persists)    Equipment Recommendations  Other (comment) (Recommended cane, pt refuses)    Recommendations for Other Services       Precautions / Restrictions Precautions Precautions: Fall Restrictions Weight Bearing Restrictions: No      Mobility  Bed Mobility Overal bed mobility: Independent                Transfers Overall transfer level: Needs assistance Equipment used: None Transfers: Sit to/from Stand Sit to Stand: Supervision         General transfer comment: supervision for safety. Mild instability upon standing but able to self correct. Cues for awareness.  Ambulation/Gait Ambulation/Gait assistance: Min assist Ambulation Distance (Feet): 500 Feet Assistive device: None Gait Pattern/deviations: Step-through pattern;Wide base of support;Staggering left;Staggering right   Gait velocity interpretation: at or above normal speed for age/gender General Gait Details: Majority of bout ambulating at a supervision level however with dynamic challenges such as horizontal and vertical head turns while ambulating, pt  demonstrates LOB requiring min assist to correct. Very guarded when stepping backwards, slight change in speed with variable speeds and more instability noted with slower speed. Tolerated high marches well. Refuses to use assistive device of any kind. Cues for awareness throughout.  Stairs Stairs: Yes Stairs assistance: Supervision Stair Management: One rail Right;Alternating pattern;Forwards Number of Stairs: 5 General stair comments: no loss of balance while holding rail. Cues to take her time and not rush.  Wheelchair Mobility    Modified Rankin (Stroke Patients Only)       Balance Overall balance assessment: Needs assistance Sitting-balance support: No upper extremity supported;Feet supported Sitting balance-Leahy Scale: Normal     Standing balance support: No upper extremity supported Standing balance-Leahy Scale: Fair                               Pertinent Vitals/Pain Pain Assessment: No/denies pain    Home Living Family/patient expects to be discharged to:: Private residence Living Arrangements: Spouse/significant other Available Help at Discharge: Family;Available PRN/intermittently Type of Home: House Home Access: Stairs to enter Entrance Stairs-Rails: Right Entrance Stairs-Number of Steps: 5 Home Layout: One level Home Equipment: None      Prior Function Level of Independence: Independent         Comments: 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    Exercises        Assessment/Plan    PT Assessment Patient needs continued PT services  PT Diagnosis Abnormality of gait;Difficulty walking   PT Problem List Decreased balance;Decreased mobility;Decreased safety awareness  PT Treatment Interventions DME instruction;Gait training;Functional mobility training;Therapeutic activities;Therapeutic exercise;Balance training;Patient/family education   PT Goals (Current goals can be found in the Care Plan section) Acute Rehab PT Goals Patient Stated Goal: Go home PT Goal Formulation: With patient Time For Goal Achievement: 01/09/15 Potential to Achieve Goals: Good    Frequency Min 3X/week   Barriers to discharge        Co-evaluation               End of Session   Activity Tolerance: Patient tolerated treatment well Patient left: in bed;with call bell/phone within reach;with bed alarm set Nurse Communication: Mobility status         Time: 8119-1478 PT Time Calculation (min) (ACUTE ONLY): 14 min   Charges:   PT Evaluation $Initial PT Evaluation Tier I: 1 Procedure     PT G CodesBerton Mount 12/26/2014, 6:27 PM Tracy Kelly, Tracy Kelly [phone]

## 2014-12-26 NOTE — Consult Note (Signed)
Admission H&P    Chief Complaint: Multiple witnessed seizures.  HPI: Tracy Kelly is an 44 y.o. female with no previously documented medical's order, brought to the emergency room following a witnessed generalized seizures. She also had multiple seizures after arriving in the emergency room. She has no documented history of previous seizure activity. The husband describes recurrent spells of staring and inattentiveness intermittently, which she has no memory. These have been attributed to anxiety for many years. Patient has only 1 previous episode when she was found unresponsive on the floor when she was in her early 44s. Urine drug screen was positive for benzodiazepine. Patient was given Versed en route to the emergency room. CT scan of her head showed no acute intracranial abnormality. She was given a loading dose of Keppra 1000 mg IV and no further seizure activity was reported. Mental status has continued to be improved.  History reviewed. No pertinent past medical history.  History reviewed. No pertinent past surgical history.  History reviewed. No pertinent family history.   Social History:  reports that she has never smoked. She does not have any smokeless tobacco history on file. She reports that she does not drink alcohol or use illicit drugs.  Allergies:  Allergies  Allergen Reactions  . Amoxicillin     hives  . Penicillins     hives   Medications: Patient was on no medications prior to admission.  ROS: History obtained from spouse  General ROS: negative for - chills, fatigue, fever, night sweats, weight gain or weight loss Psychological ROS: negative for - behavioral disorder, hallucinations, memory difficulties, mood swings or suicidal ideation Ophthalmic ROS: negative for - blurry vision, double vision, eye pain or loss of vision ENT ROS: negative for - epistaxis, nasal discharge, oral lesions, sore throat, tinnitus or vertigo Allergy and Immunology ROS: negative for  - hives or itchy/watery eyes Hematological and Lymphatic ROS: negative for - bleeding problems, bruising or swollen lymph nodes Endocrine ROS: negative for - galactorrhea, hair pattern changes, polydipsia/polyuria or temperature intolerance Respiratory ROS: negative for - cough, hemoptysis, shortness of breath or wheezing Cardiovascular ROS: negative for - chest pain, dyspnea on exertion, edema or irregular heartbeat Gastrointestinal ROS: negative for - abdominal pain, diarrhea, hematemesis, nausea/vomiting or stool incontinence Genito-Urinary ROS: negative for - dysuria, hematuria, incontinence or urinary frequency/urgency Musculoskeletal ROS: negative for - joint swelling or muscular weakness Neurological ROS: as noted in HPI Dermatological ROS: negative for rash and skin lesion changes  Physical Examination: Blood pressure 107/59, pulse 87, temperature 99.5 F (37.5 C), resp. rate 18, height _0  (1.676 m), weight 77.111 kg (170 lb), SpO2 99 %.  HEENT-  Normocephalic, no lesions, without obvious abnormality.  Normal external eye and conjunctiva.  Normal TM's bilaterally.  Normal auditory canals and external ears. Normal external nose, mucus membranes and septum.  Normal pharynx. Neck supple with no masses, nodes, nodules or enlargement. Cardiovascular - regular rate and rhythm, S1, S2 normal, no murmur, click, rub or gallop Lungs - chest clear, no wheezing, rales, normal symmetric air entry Abdomen - soft, non-tender; bowel sounds normal; no masses,  no organomegaly Extremities - no joint deformities, effusion, or inflammation, no edema and no skin discoloration  Neurologic Examination: Mental Status: Lethargic, oriented, no acute distress.  Speech fluent without evidence of aphasia. Able to follow commands without difficulty. Cranial Nerves: II-Visual fields were normal. III/IV/VI-Pupils were equal and reacted. Extraocular movements were full and conjugate.    V/VII-no facial  numbness and no facial  weakness. VIII-normal. X-normal speech and symmetrical palatal movement. XI: trapezius strength/neck flexion strength normal bilaterally XII-midline tongue extension with normal strength. Motor: 5/5 bilaterally with normal tone and bulk Sensory: Normal throughout. Deep Tendon Reflexes: 2+ and symmetric. Plantars: Flexor bilaterally Cerebellar: Normal finger-to-nose testing. Carotid auscultation: Normal Meningeal signs: Negative  Results for orders placed or performed during the hospital encounter of 12/25/14 (from the past 48 hour(s))  CBG monitoring, ED     Status: Abnormal   Collection Time: 12/25/14  7:25 PM  Result Value Ref Range   Glucose-Capillary 164 (H) 65 - 99 mg/dL  Acetaminophen level     Status: Abnormal   Collection Time: 12/25/14  7:46 PM  Result Value Ref Range   Acetaminophen (Tylenol), Serum <10 (L) 10 - 30 ug/mL    Comment:        THERAPEUTIC CONCENTRATIONS VARY SIGNIFICANTLY. A RANGE OF 10-30 ug/mL MAY BE AN EFFECTIVE CONCENTRATION FOR MANY PATIENTS. HOWEVER, SOME ARE BEST TREATED AT CONCENTRATIONS OUTSIDE THIS RANGE. ACETAMINOPHEN CONCENTRATIONS >150 ug/mL AT 4 HOURS AFTER INGESTION AND >50 ug/mL AT 12 HOURS AFTER INGESTION ARE OFTEN ASSOCIATED WITH TOXIC REACTIONS.   Ethanol     Status: None   Collection Time: 12/25/14  7:46 PM  Result Value Ref Range   Alcohol, Ethyl (B) <5 <5 mg/dL    Comment:        LOWEST DETECTABLE LIMIT FOR SERUM ALCOHOL IS 5 mg/dL FOR MEDICAL PURPOSES ONLY   Salicylate level     Status: None   Collection Time: 12/25/14  7:46 PM  Result Value Ref Range   Salicylate Lvl <0.8 2.8 - 30.0 mg/dL  Comprehensive metabolic panel     Status: Abnormal   Collection Time: 12/25/14  7:46 PM  Result Value Ref Range   Sodium 138 135 - 145 mmol/L   Potassium 3.7 3.5 - 5.1 mmol/L   Chloride 108 101 - 111 mmol/L   CO2 20 (L) 22 - 32 mmol/L   Glucose, Bld 150 (H) 65 - 99 mg/dL   BUN 12 6 - 20 mg/dL    Creatinine, Ser 0.76 0.44 - 1.00 mg/dL   Calcium 8.5 (L) 8.9 - 10.3 mg/dL   Total Protein 6.2 (L) 6.5 - 8.1 g/dL   Albumin 3.4 (L) 3.5 - 5.0 g/dL   AST 29 15 - 41 U/L   ALT 17 14 - 54 U/L   Alkaline Phosphatase 46 38 - 126 U/L   Total Bilirubin 0.5 0.3 - 1.2 mg/dL   GFR calc non Af Amer >60 >60 mL/min   GFR calc Af Amer >60 >60 mL/min    Comment: (NOTE) The eGFR has been calculated using the CKD EPI equation. This calculation has not been validated in all clinical situations. eGFR's persistently <60 mL/min signify possible Chronic Kidney Disease.    Anion gap 10 5 - 15  CBC     Status: None   Collection Time: 12/25/14  7:46 PM  Result Value Ref Range   WBC 5.4 4.0 - 10.5 K/uL   RBC 4.11 3.87 - 5.11 MIL/uL   Hemoglobin 12.7 12.0 - 15.0 g/dL   HCT 38.1 36.0 - 46.0 %   MCV 92.7 78.0 - 100.0 fL   MCH 30.9 26.0 - 34.0 pg   MCHC 33.3 30.0 - 36.0 g/dL   RDW 13.1 11.5 - 15.5 %   Platelets  150 - 400 K/uL    PLATELET CLUMPS NOTED ON SMEAR, COUNT APPEARS DECREASED  I-Stat CG4 Lactic Acid, ED  Status: Abnormal   Collection Time: 12/25/14  7:58 PM  Result Value Ref Range   Lactic Acid, Venous 4.58 (HH) 0.5 - 2.0 mmol/L   Comment NOTIFIED PHYSICIAN   Pregnancy, urine  ( [facility] Only)     Status: None   Collection Time: 12/25/14  8:00 PM  Result Value Ref Range   Preg Test, Ur NEGATIVE NEGATIVE    Comment:        THE SENSITIVITY OF THIS METHODOLOGY IS >20 mIU/mL.   Urine rapid drug screen (hosp performed)not at [facility]     Status: Abnormal   Collection Time: 12/25/14  8:05 PM  Result Value Ref Range   Opiates NONE DETECTED NONE DETECTED   Cocaine NONE DETECTED NONE DETECTED   Benzodiazepines POSITIVE (A) NONE DETECTED   Amphetamines NONE DETECTED NONE DETECTED   Tetrahydrocannabinol NONE DETECTED NONE DETECTED   Barbiturates NONE DETECTED NONE DETECTED    Comment:        DRUG SCREEN FOR MEDICAL PURPOSES ONLY.  IF CONFIRMATION IS NEEDED FOR ANY PURPOSE, NOTIFY LAB WITHIN 5  DAYS.        LOWEST DETECTABLE LIMITS FOR URINE DRUG SCREEN Drug Class       Cutoff (ng/mL) Amphetamine      1000 Barbiturate      200 Benzodiazepine   676 Tricyclics       720 Opiates          300 Cocaine          300 THC              50   POC Urine Pregnancy, ED  (if pt is a pre-menopausal female)  - NOT at [facility]     Status: None   Collection Time: 12/25/14  8:11 PM  Result Value Ref Range   Preg Test, Ur NEGATIVE NEGATIVE    Comment:        THE SENSITIVITY OF THIS METHODOLOGY IS >24 mIU/mL   I-Stat CG4 Lactic Acid, ED     Status: None   Collection Time: 12/25/14 11:09 PM  Result Value Ref Range   Lactic Acid, Venous 0.56 0.5 - 2.0 mmol/L   Ct Head Wo Contrast  12/25/2014   CLINICAL DATA:  New onset seizure, lasted for 3 minutes  EXAM: CT HEAD WITHOUT CONTRAST  TECHNIQUE: Contiguous axial images were obtained from the base of the skull through the vertex without intravenous contrast.  COMPARISON:  None.  FINDINGS: Mild asymmetry in the lateral ventricles within normal limits of variability. Normal sulcation. Minimal calcification of the bilateral basal ganglia. No evidence of hemorrhage or mass no evidence of infarct or extra-axial fluid. Calvarium is intact. Visualized portions of the paranasal sinuses are clear.  IMPRESSION: No significant abnormalities   Electronically Signed   By: Skipper Cliche M.D.   On: 12/25/2014 21:52   Dg Chest Portable 1 View  12/25/2014   CLINICAL DATA:  Seizure  EXAM: PORTABLE CHEST - 1 VIEW  COMPARISON:  None.  FINDINGS: Lungs are hypoaerated with crowding of the bronchovascular markings. Leads obscure detail. Heart size is normal. No pleural effusion. No pneumothorax. No acute osseous finding.  IMPRESSION: Low volume exam with crowding of the bronchovascular markings at the bases but no focal acute finding.   Electronically Signed   By: Conchita Paris M.D.   On: 12/25/2014 20:06    Assessment/Plan 44 year old lady presenting with complex partial  seizures with secondary generalization. 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 Clinical exam and CT scan were unremarkable.  Recommendations: 1. Continue Keppra 500 mg twice a day 2. EEG, routine adult study 3. MRI of the brain without and with contrast 4. Outpatient neurology follow-up on discharge  C.R. Nicole Kindred, MD [facility] [phone]  12/26/2014, 12:32 AM

## 2014-12-26 NOTE — Clinical Social Work Note (Signed)
Clinical Social Worker received referral for financial concerns.  Chart reviewed.  Patient requiring medication assistance at discharge.  Spoke with RN Case Manager who will follow up with patient to discuss at discharge.    CSW signing off - please re consult if social work needs arise.  Macario Golds, [place] [phone]

## 2014-12-26 NOTE — Progress Notes (Signed)
MEDICATION RELATED CONSULT NOTE - INITIAL   Pharmacy Consult for AED monitoring Indication: seizures  Allergies  Allergen Reactions  . Amoxicillin     hives  . Penicillins     hives    Patient Measurements: Height:  (167.6 cm) Weight: 170 lb (77.111 kg) IBW/kg (Calculated) : 59.3  Vital Signs: Temp: 99.5 F (37.5 C) (08/29 2254) BP: 107/58 mmHg (08/30 0045) Pulse Rate: 87 (08/29 2115) Intake/Output from previous day: 08/29 0701 - 08/30 0700 In: 110 [IV Piggyback:110] Out: -  Intake/Output from this shift: Total I/O In: 110 [IV Piggyback:110] Out: -   Labs:  Recent Labs  12/25/14 1946  WBC 5.4  HGB 12.7  HCT 38.1  PLT PLATELET CLUMPS NOTED ON SMEAR, COUNT APPEARS DECREASED  CREATININE 0.76  ALBUMIN 3.4*  PROT 6.2*  AST 29  ALT 17  ALKPHOS 46  BILITOT 0.5   Estimated Creatinine Clearance: 96 mL/min (by C-G formula based on Cr of 0.76).   Microbiology: No results found for this or any previous visit (from the past 720 hour(s)).  Medical History: History reviewed. No pertinent past medical history.  Medications:  See electronic med rec  Assessment: 44 y.o. female presents with witnessed generalized seizures. Noted with multiple seizures in ED. No documented h/o previous seizure activity although husband reports recurrent staring spells and inattentiveness intermittently. Loading dose of Keppra 1gm IV given with no further sz activity reported. To begin maintenance Keppra dose of  IV q12h per neurology.   No drug interactions identified  Goal of Therapy:  Seizure prevention  Plan:  Keppra  IV q12h as ordered by neurology F/u further sz activity  Christoper Fabian, PharmD, BCPS Clinical pharmacist, pager [phone] 12/26/2014,1:01 AM

## 2014-12-26 NOTE — Discharge Instructions (Signed)
Follow with Primary MD  No PCP Per Patient  and other consultant as instructed your Hospitalist MD  Please get a complete blood count and chemistry panel checked by your Primary MD at your next visit, and again as instructed by your Primary MD.  You had Seizure: Please do not drive, operate heavy machinery, participate in activities at heights or participate in high speed sports until you have seen by Primary MD or a Neurologist and advised to do so again.  Get Medicines reviewed and adjusted. Please take all your medications with you for your next visit with your Primary MD  Please request your Primary MD to go over all hospital tests and procedure/radiological results at the follow up, please ask your Primary MD to get all Hospital records sent to his/her office.  If you experience worsening of your admission symptoms, develop shortness of breath, life threatening emergency, suicidal or homicidal thoughts you must seek medical attention immediately by calling 911 or calling your MD immediately  if symptoms less severe.  You must read complete instructions/literature along with all the possible adverse reactions/side effects for all the Medicines you take and that have been prescribed to you. Take any new Medicines after you have completely understood and accpet all the possible adverse reactions/side effects.   Do not drive when taking Pain medications.   Do not take more than prescribed Pain, Sleep and Anxiety Medications  Special Instructions: If you have smoked or chewed Tobacco  in the last 2 yrs please stop smoking, stop any regular Alcohol  and or any Recreational drug use.  Wear Seat belts while driving.  Please note  You were cared for by a hospitalist during your hospital stay. Once you are discharged, your primary care physician will handle any further medical issues. Please note that NO REFILLS for any discharge medications will be authorized once you are discharged, as it is  imperative that you return to your primary care physician (or establish a relationship with a primary care physician if you do not have one) for your aftercare needs so that they can reassess your need for medications and monitor your lab values.

## 2014-12-26 NOTE — Progress Notes (Addendum)
PATIENT DETAILS Name: Tracy Kelly Age: 44 y.o. Sex: female Date of Birth: April 18, 1971 Admit Date: 12/25/2014 Admitting Physician Rolly Salter, MD PCP:No PCP Per Patient  Subjective: No further seizures since admission.  Assessment/Plan: Principal Problem: Seizure disorder: No further seizures since admission, consulted by neurology. Underwent MRI and EEG both negative. Spoke with Dr. Cyril Mourning over the phone-no need to repeat MRI, recommendations are to continue with Keppra and follow up with neurology as outpatient.  Active Problems: Hypokalemia: Repleted.   Disposition: Remain inpatient-home in am  Antimicrobial agents  See below  Anti-infectives    None      DVT Prophylaxis: Prophylactic Lovenox   Code Status: Full code   Family Communication Spouse at bedside  Procedures: None  CONSULTS:  neurology  Time spent 30 minutes-Greater than 50% of this time was spent in counseling, explanation of diagnosis, planning of further management, and coordination of care.  MEDICATIONS: Scheduled Meds: . enoxaparin (LOVENOX) injection  40 mg Subcutaneous Q24H  . levETIRAcetam  500 mg Intravenous Q12H   Continuous Infusions:  PRN Meds:.acetaminophen **OR** acetaminophen, LORazepam, ondansetron **OR** ondansetron (ZOFRAN) IV    PHYSICAL EXAM: Vital signs in last 24 hours: Filed Vitals:   12/26/14 0114 12/26/14 0201 12/26/14 0555 12/26/14 1300  BP: 103/62  90/77 95/60  Pulse: 90  90 85  Temp: 99.2 F (37.3 C)  98.1 F (36.7 C) 98.5 F (36.9 C)  TempSrc: Oral  Oral Oral  Resp: 18  18 20   Height:  5\' 6"  (1.676 m)    Weight:  77.248 kg (170 lb 4.8 oz)    SpO2: 100%  98% 97%    Weight change:  Filed Weights   12/25/14 1929 12/26/14 0201  Weight: 77.111 kg (170 lb) 77.248 kg (170 lb 4.8 oz)   Body mass index is 27.5 kg/(m^2).   Gen Exam: Awake and alert with clear speech.   Neck: Supple, No JVD.   Chest: B/L Clear.   CVS: S1 S2  Regular, no murmurs.  Abdomen: soft, BS +, non tender, non distended.  Extremities: no edema, lower extremities warm to touch. Neurologic: Non Focal.   Skin: No Rash.   Wounds: N/A.   Intake/Output from previous day:  Intake/Output Summary (Last 24 hours) at 12/26/14 1347 Last data filed at 12/26/14 1322  Gross per 24 hour  Intake 1301.25 ml  Output    800 ml  Net 501.25 ml     LAB RESULTS: CBC  Recent Labs Lab 12/25/14 1946 12/26/14 0310  WBC 5.4 9.9  HGB 12.7 11.5*  HCT 38.1 34.5*  PLT PLATELET CLUMPS NOTED ON SMEAR, COUNT APPEARS DECREASED 124*  MCV 92.7 92.0  MCH 30.9 30.7  MCHC 33.3 33.3  RDW 13.1 13.1  LYMPHSABS  --  1.3  MONOABS  --  0.9  EOSABS  --  0.0  BASOSABS  --  0.0    Chemistries   Recent Labs Lab 12/25/14 1946 12/26/14 0310 12/26/14 1105  NA 138 137 142  K 3.7 2.8* 3.7  CL 108 109 113*  CO2 20* 22 25  GLUCOSE 150* 96 109*  BUN 12 8 6   CREATININE 0.76 0.60 0.56  CALCIUM 8.5* 7.8* 8.3*  MG  --  2.0  --     CBG:  Recent Labs Lab 12/25/14 1925  GLUCAP 164*    GFR Estimated Creatinine Clearance: 96.2 mL/min (by C-G formula based on Cr  of 0.56).  Coagulation profile No results for input(s): INR, PROTIME in the last 168 hours.  Cardiac Enzymes No results for input(s): CKMB, TROPONINI, MYOGLOBIN in the last 168 hours.  Invalid input(s): CK  Invalid input(s): POCBNP No results for input(s): DDIMER in the last 72 hours. No results for input(s): HGBA1C in the last 72 hours. No results for input(s): CHOL, HDL, LDLCALC, TRIG, CHOLHDL, LDLDIRECT in the last 72 hours. No results for input(s): TSH, T4TOTAL, T3FREE, THYROIDAB in the last 72 hours.  Invalid input(s): FREET3 No results for input(s): VITAMINB12, FOLATE, FERRITIN, TIBC, IRON, RETICCTPCT in the last 72 hours. No results for input(s): LIPASE, AMYLASE in the last 72 hours.  Urine Studies No results for input(s): UHGB, CRYS in the last 72 hours.  Invalid input(s): UACOL,  UAPR, USPG, UPH, UTP, UGL, UKET, UBIL, UNIT, UROB, ULEU, UEPI, UWBC, URBC, UBAC, CAST, UCOM, BILUA  MICROBIOLOGY: No results found for this or any previous visit (from the past 240 hour(s)).  RADIOLOGY STUDIES/RESULTS: Ct Head Wo Contrast  12/25/2014   CLINICAL DATA:  New onset seizure, lasted for 3 minutes  EXAM: CT HEAD WITHOUT CONTRAST  TECHNIQUE: Contiguous axial images were obtained from the base of the skull through the vertex without intravenous contrast.  COMPARISON:  None.  FINDINGS: Mild asymmetry in the lateral ventricles within normal limits of variability. Normal sulcation. Minimal calcification of the bilateral basal ganglia. No evidence of hemorrhage or mass no evidence of infarct or extra-axial fluid. Calvarium is intact. Visualized portions of the paranasal sinuses are clear.  IMPRESSION: No significant abnormalities   Electronically Signed   By: Esperanza Heir M.D.   On: 12/25/2014 21:52   Mr Laqueta Jean YN Contrast  12/26/2014   CLINICAL DATA:  Increasing frequency of panic attacks and associated seizures.  EXAM: MRI HEAD WITHOUT AND WITH CONTRAST  TECHNIQUE: Multiplanar, multiecho pulse sequences of the brain and surrounding structures were obtained without and with intravenous contrast.  CONTRAST:  15mL MULTIHANCE GADOBENATE DIMEGLUMINE 529 MG/ML IV SOLN  COMPARISON:  CT head December 25, 2014, MRI of the brain report dated November 02, 1999 though images are not available for direct comparison.  FINDINGS: The ventricles and sulci are normal for patient's age. No abnormal parenchymal signal, mass lesions, mass effect. No abnormal parenchymal enhancement. No reduced diffusion to suggest acute ischemia. No susceptibility artifact to suggest hemorrhage.  No abnormal extra-axial fluid collections. No extra-axial masses nor leptomeningeal enhancement. Normal major intracranial vascular flow voids seen at the skull base. Thin slice coronal T2 is markedly oblique due to patient positioning, the LEFT  hippocampus appears slightly smaller than the RIGHT.  Ocular globes and orbital contents are unremarkable though not tailored for evaluation. No abnormal sellar expansion. Visualized paranasal sinuses and mastoid air cells are well-aerated. No suspicious calvarial bone marrow signal. No abnormal sellar expansion. Craniocervical junction maintained.  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 Recommend follow-up thin slice coronal T2 with improved position when patient is better able to tolerate imaging.  Otherwise unremarkable MRI of the brain with contrast.   Electronically Signed   By: Awilda Metro M.D.   On: 12/26/2014 06:05   Dg Chest Portable 1 View  12/25/2014   CLINICAL DATA:  Seizure  EXAM: PORTABLE CHEST - 1 VIEW  COMPARISON:  None.  FINDINGS: Lungs are hypoaerated with crowding of the bronchovascular markings. Leads obscure detail. Heart size is normal. No pleural effusion.  No pneumothorax. No acute osseous finding.  IMPRESSION: Low volume exam with crowding of the bronchovascular markings at the bases but no focal acute finding.   Electronically Signed   By: Christiana Pellant M.D.   On: 12/25/2014 20:06    Jeoffrey Massed, MD  [facility] Pager:[phone]  If 7PM-7AM, please contact night-coverage [email or web address] Password [email or web address] 12/26/2014, 1:47 PM   LOS: 0 days

## 2014-12-27 DIAGNOSIS — E876 Hypokalemia: Secondary | ICD-10-CM

## 2014-12-27 DIAGNOSIS — G40909 Epilepsy, unspecified, not intractable, without status epilepticus: Secondary | ICD-10-CM

## 2014-12-27 MED ORDER — LEVETIRACETAM 500 MG PO TABS: 500.0000 mg | ORAL_TABLET | Freq: Two times a day (BID) | ORAL | Status: DC

## 2014-12-27 MED ORDER — LEVETIRACETAM 500 MG PO TABS
500.0000 mg | ORAL_TABLET | Freq: Two times a day (BID) | ORAL | Status: DC
  Administered 2014-12-27: 500 mg via ORAL
  Filled 2014-12-27: qty 1

## 2014-12-27 NOTE — Discharge Summary (Signed)
PATIENT DETAILS Name: Tracy Kelly Age: 44 y.o. Sex: female Date of Birth: [birth year] MRN: [id number]. Admitting Physician: Rolly Salter, MD PCP:No PCP Per Patient  Admit Date: 12/25/2014 Discharge date: 12/27/2014  Recommendations for Outpatient Follow-up:  1. Age appropriate general health maintenance 2. Please repeat MRI brain in 1 month (see MRI report)  PRIMARY DISCHARGE DIAGNOSIS:  Principal Problem:   Seizure disorder Active Problems:   Hypokalemia      PAST MEDICAL HISTORY: History reviewed. No pertinent past medical history.  DISCHARGE MEDICATIONS: Current Discharge Medication List    START taking these medications   Details  levETIRAcetam (KEPPRA) 500 MG tablet Take 1 tablet (500 mg total) by mouth 2 (two) times daily. Qty: 90 tablet, Refills: 0      STOP taking these medications     oxyCODONE-acetaminophen (PERCOCET/ROXICET) 5-325 MG per tablet      promethazine (PHENERGAN) 25 MG tablet         ALLERGIES:   Allergies  Allergen Reactions  . Amoxicillin     hives  . Penicillins     hives    BRIEF HPI:  See H&P, Labs, Consult and Test reports for all details in brief, patient was admitted for Evaluation of seizures.  CONSULTATIONS:   neurology  PERTINENT RADIOLOGIC STUDIES: Ct Head Wo Contrast  12/25/2014   CLINICAL DATA:  New onset seizure, lasted for 3 minutes  EXAM: CT HEAD WITHOUT CONTRAST  TECHNIQUE: Contiguous axial images were obtained from the base of the skull through the vertex without intravenous contrast.  COMPARISON:  None.  FINDINGS: Mild asymmetry in the lateral ventricles within normal limits of variability. Normal sulcation. Minimal calcification of the bilateral basal ganglia. No evidence of hemorrhage or mass no evidence of infarct or extra-axial fluid. Calvarium is intact. Visualized portions of the paranasal sinuses are clear.  IMPRESSION: No significant abnormalities   Electronically Signed   By: Esperanza Heir M.D.    On: 12/25/2014 21:52   Mr Laqueta Jean WU Contrast  12/26/2014   CLINICAL DATA:  Increasing frequency of panic attacks and associated seizures.  EXAM: MRI HEAD WITHOUT AND WITH CONTRAST  TECHNIQUE: Multiplanar, multiecho pulse sequences of the brain and surrounding structures were obtained without and with intravenous contrast.  CONTRAST:  15mL MULTIHANCE GADOBENATE DIMEGLUMINE 529 MG/ML IV SOLN  COMPARISON:  CT head December 25, 2014, MRI of the brain report dated November 02, 1999 though images are not available for direct comparison.  FINDINGS: The ventricles and sulci are normal for patient's age. No abnormal parenchymal signal, mass lesions, mass effect. No abnormal parenchymal enhancement. No reduced diffusion to suggest acute ischemia. No susceptibility artifact to suggest hemorrhage.  No abnormal extra-axial fluid collections. No extra-axial masses nor leptomeningeal enhancement. Normal major intracranial vascular flow voids seen at the skull base. Thin slice coronal T2 is markedly oblique due to patient positioning, the LEFT hippocampus appears slightly smaller than the RIGHT.  Ocular globes and orbital contents are unremarkable though not tailored for evaluation. No abnormal sellar expansion. Visualized paranasal sinuses and mastoid air cells are well-aerated. No suspicious calvarial bone marrow signal. No abnormal sellar expansion. Craniocervical junction maintained.  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 Recommend follow-up thin slice coronal T2 with improved position when patient is better able to tolerate imaging.  Otherwise unremarkable MRI of the brain with contrast.   Electronically Signed   By: Michel Santee.D.  On: 12/26/2014 06:05   Dg Chest Portable 1 View  12/25/2014   CLINICAL DATA:  Seizure  EXAM: PORTABLE CHEST - 1 VIEW  COMPARISON:  None.  FINDINGS: Lungs are hypoaerated with  crowding of the bronchovascular markings. Leads obscure detail. Heart size is normal. No pleural effusion. No pneumothorax. No acute osseous finding.  IMPRESSION: Low volume exam with crowding of the bronchovascular markings at the bases but no focal acute finding.   Electronically Signed   By: Christiana Pellant M.D.   On: 12/25/2014 20:06     PERTINENT LAB RESULTS: CBC:  Recent Labs  12/25/14 1946 12/26/14 0310  WBC 5.4 9.9  HGB 12.7 11.5*  HCT 38.1 34.5*  PLT PLATELET CLUMPS NOTED ON SMEAR, COUNT APPEARS DECREASED 124*   CMET CMP     Component Value Date/Time   NA 142 12/26/2014 1105   K 3.7 12/26/2014 1105   CL 113* 12/26/2014 1105   CO2 25 12/26/2014 1105   GLUCOSE 109* 12/26/2014 1105   BUN 6 12/26/2014 1105   CREATININE 0.56 12/26/2014 1105   CALCIUM 8.3* 12/26/2014 1105   PROT 5.2* 12/26/2014 0310   ALBUMIN 3.1* 12/26/2014 0310   AST 21 12/26/2014 0310   ALT 15 12/26/2014 0310   ALKPHOS 42 12/26/2014 0310   BILITOT 0.9 12/26/2014 0310   GFRNONAA >60 12/26/2014 1105   GFRAA >60 12/26/2014 1105    GFR Estimated Creatinine Clearance: 96.2 mL/min (by C-G formula based on Cr of 0.56). No results for input(s): LIPASE, AMYLASE in the last 72 hours. No results for input(s): CKTOTAL, CKMB, CKMBINDEX, TROPONINI in the last 72 hours. Invalid input(s): POCBNP No results for input(s): DDIMER in the last 72 hours. No results for input(s): HGBA1C in the last 72 hours. No results for input(s): CHOL, HDL, LDLCALC, TRIG, CHOLHDL, LDLDIRECT in the last 72 hours. No results for input(s): TSH, T4TOTAL, T3FREE, THYROIDAB in the last 72 hours.  Invalid input(s): FREET3 No results for input(s): VITAMINB12, FOLATE, FERRITIN, TIBC, IRON, RETICCTPCT in the last 72 hours. Coags: No results for input(s): INR in the last 72 hours.  Invalid input(s): PT Microbiology: No results found for this or any previous visit (from the past 240 hour(s)).   BRIEF HOSPITAL COURSE:  Seizure  disorder: No further seizures since admission, consulted by neurology. Underwent MRI Brain (see below) and EEG both negative. Spoke with Dr. Cyril Mourning over the phone-no need to repeat MRI, recommendations are to continue with Keppra and follow up with neurology as outpatient.  Please note:MRI Brain was a Limited evaluation due to positioning, slightly smaller appearing LEFT hippocampus can be seen with mesial temporal sclerosis or,artifact. Spoke with neurology today, did recommend repeating an MRI in a month's time.  Please also note-patient was asked not to drive and participate in activities at heights. Please see below for discharge instructions  Active Problems: Hypokalemia: Repleted.    TODAY-DAY OF DISCHARGE:  Subjective:   Tracy Kelly today has no headache,no chest abdominal pain,no new weakness tingling or numbness, feels much better wants to go home today.   Objective:   Blood pressure 103/62, pulse 82, temperature 97.7 F (36.5 C), temperature source Oral, resp. rate 16, height 5\' 6"  (1.676 m), weight 77.248 kg (170 lb 4.8 oz), SpO2 98 %.  Intake/Output Summary (Last 24 hours) at 12/27/14 1013 Last data filed at 12/27/14 0947  Gross per 24 hour  Intake    560 ml  Output   2200 ml  Net  -1640 ml  Filed Weights   12/25/14 1929 12/26/14 0201  Weight: 77.111 kg (170 lb) 77.248 kg (170 lb 4.8 oz)    Exam Awake Alert, Oriented *3, No new F.N deficits, Normal affect [place].AT,PERRAL Supple Neck,No JVD, No cervical lymphadenopathy appriciated.  Symmetrical Chest wall movement, Good air movement bilaterally, CTAB RRR,No Gallops,Rubs or new Murmurs, No Parasternal Heave +ve B.Sounds, Abd Soft, Non tender, No organomegaly appriciated, No rebound -guarding or rigidity. No Cyanosis, Clubbing or edema, No new Rash or bruise  DISCHARGE CONDITION: Stable  DISPOSITION: Home  DISCHARGE INSTRUCTIONS:    Activity:  As tolerated   Get Medicines reviewed and adjusted: Please  take all your medications with you for your next visit with your Primary MD  Please request your Primary MD to go over all hospital tests and procedure/radiological results at the follow up, please ask your Primary MD to get all Hospital records sent to his/her office.  If you experience worsening of your admission symptoms, develop shortness of breath, life threatening emergency, suicidal or homicidal thoughts you must seek medical attention immediately by calling 911 or calling your MD immediately  if symptoms less severe.  You must read complete instructions/literature along with all the possible adverse reactions/side effects for all the Medicines you take and that have been prescribed to you. Take any new Medicines after you have completely understood and accpet all the possible adverse reactions/side effects.   Do not drive when taking Pain medications.   Do not take more than prescribed Pain, Sleep and Anxiety Medications  Special Instructions: If you have smoked or chewed Tobacco  in the last 2 yrs please stop smoking, stop any regular Alcohol  and or any Recreational drug use.  Wear Seat belts while driving.  Please note  You were cared for by a hospitalist during your hospital stay. Once you are discharged, your primary care physician will handle any further medical issues. Please note that NO REFILLS for any discharge medications will be authorized once you are discharged, as it is imperative that you return to your primary care physician (or establish a relationship with a primary care physician if you do not have one) for your aftercare needs so that they can reassess your need for medications and monitor your lab values.   Diet recommendation: Regular Diet   Discharge Instructions    Ambulatory referral to Neurology    Complete by:  As directed   seizure     Call MD for:    Complete by:  As directed   RECURRENT SEIZURES     Diet general    Complete by:  As directed        Discharge instructions    Complete by:  As directed   Please do not drive, operate heavy machinery, participate in activities at heights or participate in high speed sports until you have seen by Primary MD or a Neurologist and advised to do so again.     Increase activity slowly    Complete by:  As directed            Follow-up Information    Follow up with [facility] On 01/04/2015.   Why:  appointment at 12:00. Please come a few minutes early and bring your photo ID. You may fill your Rx at the pharmacy there on the day you are discharged from the hospital.    Contact information:   [address] [place] [address] [phone]  Follow up with Van Clines, MD.   Specialty:  Neurology   Why:  office will call-IF YOU DONT HEAR FROM THE OFFICE-PLEASE CALL THE ABOVE NO AND SCHEDULE A  APPOINTMENT   Contact information:   [address] [place] [address] [phone]      Total Time spent on discharge equals 25  minutes.  SignedJeoffrey Massed 12/27/2014 10:13 AM

## 2014-12-27 NOTE — Care Management Note (Signed)
Case Management Note  Patient Details  Name: Tracy Kelly MRN: [id number] Date of Birth: Sep 22, 1970  Subjective/Objective:                 Spoke with patient and husband at bedside. Asked for help with obtaining insurance. Left message with Marcene Corning, financial counselor, and provided patient and husband with contact number. Patient received pamphlet from [facility] and [facility]. CM explained to patient that they may use the on site pharmacy to fill prescriptions given to them at discharge. Patient aware that the [facility] pharmacy will not fill narcotics or pain medications prior to the patient being seen by one of their physicians. Patient aware that they must be seen as a patient prior to the pharmacy filling the prescriptions a second time.    Patient declined to have follow up PT as outpatient stating that PT had just finished with her and she does not need it. Patient and spouse given information on [facility] if they chose to follow up at a later time.    Action/Plan:  DC to home with spouse, FU at [facility], appointment made.   Expected Discharge Date:  12/27/14               Expected Discharge Plan:  Home/Self Care  In-House Referral:     Discharge planning Services  CM Consult, [facility]  Post Acute Care Choice:    Choice offered to:     DME Arranged:    DME Agency:     HH Arranged:    HH Agency:     Status of Service:  Completed, signed off  Medicare Important Message Given:    Date Medicare IM Given:    Medicare IM give by:    Date Additional Medicare IM Given:    Additional Medicare Important Message give by:     If discussed at Long Length of Stay Meetings, dates discussed:    Additional Comments:  Lawerance Sabal, RN 12/27/2014, 11:51 AM

## 2014-12-27 NOTE — Progress Notes (Signed)
Patient and spouse received discharge instructions with verbal understanding. Patient discharged to home with spouse.

## 2014-12-27 NOTE — Progress Notes (Signed)
Physical Therapy Treatment Patient Details Name: Tracy Kelly MRN: [id number] DOB: Feb 20, 1971 Today's Date: 12/27/2014    History of Present Illness 44 y.o. female admitted for Seizure disorder and hypokalemia.    PT Comments    Pt demonstrated no LOB today with PT. Pt was able to ambulate on the unit with dynamic challenges to gait without difficulty. Pt reports she will be d/c home today. I do not recommend any follow-up therapy. Pt is safe to d/c home with her family providing assistance as needed. Pt is d/c from PT services.  Follow Up Recommendations  No PT follow up     Equipment Recommendations  None recommended by PT    Recommendations for Other Services       Precautions / Restrictions Restrictions Weight Bearing Restrictions: No    Mobility  Bed Mobility Overal bed mobility: Independent                Transfers Overall transfer level: Independent Equipment used: None Transfers: Sit to/from Stand Sit to Stand: Independent            Ambulation/Gait Ambulation/Gait assistance: Independent Ambulation Distance (Feet): 400 Feet Assistive device: None Gait Pattern/deviations: Step-through pattern   Gait velocity interpretation: at or above normal speed for age/gender General Gait Details: Pt had no LOB with challenges of head turn , varing speed, turns, and stepping over an object on level surfaces on the unit.   Stairs            Wheelchair Mobility    Modified Rankin (Stroke Patients Only)       Balance Overall balance assessment: Independent   Sitting balance-Leahy Scale: Normal       Standing balance-Leahy Scale: Normal                      Cognition Arousal/Alertness: Awake/alert Behavior During Therapy: WFL for tasks assessed/performed Overall Cognitive Status: Within Functional Limits for tasks assessed                      Exercises      General Comments        Pertinent Vitals/Pain Pain  Assessment: No/denies pain    Home Living                      Prior Function            PT Goals (current goals can now be found in the care plan section) Progress towards PT goals: Goals met/education completed, patient discharged from PT    Frequency       PT Plan Other (comment) (goals met, pt d/c)    Co-evaluation             End of Session   Activity Tolerance: Patient tolerated treatment well Patient left: in bed;with call bell/phone within reach;with family/visitor present     Time: 1002-1027 PT Time Calculation (min) (ACUTE ONLY): 25 min  Charges:  $Gait Training: 8-22 mins $Therapeutic Activity: 8-22 mins                    G Codes:      Lelon Mast 12/27/2014, 10:32 AM

## 2014-12-27 NOTE — Progress Notes (Signed)
Subjective: No further events. patient feeling well.   Objective: Current vital signs: BP 103/62 mmHg  Pulse 82  Temp(Src) 97.7 F (36.5 C) (Oral)  Resp 16  Ht  (1.676 m)  Wt 77.248 kg (170 lb 4.8 oz)  BMI 27.50 kg/m2  SpO2 98% Vital signs in last 24 hours: Temp:  [97.7 F (36.5 C)-98.6 F (37 C)] 97.7 F (36.5 C) (08/31 4540) Pulse Rate:  [76-85] 82 (08/31 0642) Resp:  [13-20] 16 (08/31 0642) BP: (92-103)/(56-62) 103/62 mmHg (08/31 0642) SpO2:  [97 %-99 %] 98 % (08/31 0642)  Intake/Output from previous day: 08/30 0701 - 08/31 0700 In: 320 [P.O.:320] Out: 1400 [Urine:1400] Intake/Output this shift: Total I/O In: -  Out: 800 [Urine:800] Nutritional status: Diet regular Room service appropriate?: Yes; Fluid consistency:: Thin  Neurologic Exam: General: Mental Status: Alert, oriented, thought content appropriate.  Speech fluent without evidence of aphasia.  Able to follow 3 step commands without difficulty. Cranial Nerves: II: Discs flat bilaterally; Visual fields grossly normal, pupils equal, round, reactive to light and accommodation III,IV, VI: ptosis not present, extra-ocular motions intact bilaterally V,VII: smile symmetric right facial droop at rest, facial light touch sensation normal bilaterally VIII: hearing normal bilaterally IX,X: uvula rises symmetrically XI: bilateral shoulder shrug XII: midline tongue extension without atrophy or fasciculations  Motor: Right : Upper extremity   5/5    Left:     Upper extremity   5/5  Lower extremity   5/5     Lower extremity   5/5 Tone and bulk:normal tone throughout; no atrophy noted Sensory: Pinprick and light touch intact throughout, bilaterally Deep Tendon Reflexes:  Right: Upper Extremity   Left: Upper extremity   biceps (C-5 to C-6) 2/4   biceps (C-5 to C-6) 2/4 tricep (C7) 2/4    triceps (C7) 2/4 Brachioradialis (C6) 2/4  Brachioradialis (C6) 2/4  Lower Extremity Lower Extremity  quadriceps (L-2 to  L-4) 2/4   quadriceps (L-2 to L-4) 2/4 Achilles (S1) 2/4   Achilles (S1) 2/4  Plantars: Right: downgoing   Left: downgoing    Lab Results: Basic Metabolic Panel:  Recent Labs Lab 12/25/14 1946 12/26/14 0310 12/26/14 1105  NA 138 137 142  K 3.7 2.8* 3.7  CL 108 109 113*  CO2 20* 22 25  GLUCOSE 150* 96 109*  BUN CREATININE 0.76 0.60 0.56  CALCIUM 8.5* 7.8* 8.3*  MG  --  2.0  --     Liver Function Tests:  Recent Labs Lab 12/25/14 1946 12/26/14 0310  AST 29 21  ALT 17 15  ALKPHOS 46 42  BILITOT 0.5 0.9  PROT 6.2* 5.2*  ALBUMIN 3.4* 3.1*   No results for input(s): LIPASE, AMYLASE in the last 168 hours. No results for input(s): AMMONIA in the last 168 hours.  CBC:  Recent Labs Lab 12/25/14 1946 12/26/14 0310  WBC 5.4 9.9  NEUTROABS  --  7.6  HGB 12.7 11.5*  HCT 38.1 34.5*  MCV 92.7 92.0  PLT PLATELET CLUMPS NOTED ON SMEAR, COUNT APPEARS DECREASED 124*    Cardiac Enzymes: No results for input(s): CKTOTAL, CKMB, CKMBINDEX, TROPONINI in the last 168 hours.  Lipid Panel: No results for input(s): CHOL, TRIG, HDL, CHOLHDL, VLDL, LDLCALC in the last 168 hours.  CBG:  Recent Labs Lab 12/25/14 1925  GLUCAP 164*    Microbiology: Results for orders placed or performed during the hospital encounter of 06/09/13  Wet prep, genital     Status: Abnormal  Collection Time: 06/09/13  8:58 AM  Result Value Ref Range Status   Yeast Wet Prep HPF POC NONE SEEN NONE SEEN Final   Trich, Wet Prep NONE SEEN NONE SEEN Final   Clue Cells Wet Prep HPF POC NONE SEEN NONE SEEN Final   WBC, Wet Prep HPF POC TOO NUMEROUS TO COUNT (A) NONE SEEN Final  GC/Chlamydia Probe Amp     Status: None   Collection Time: 06/09/13  8:58 AM  Result Value Ref Range Status   CT Probe RNA NEGATIVE NEGATIVE Final   GC Probe RNA NEGATIVE NEGATIVE Final    Comment: (NOTE)                                                                                       **Normal Reference Range:  Negative**      Assay performed using the Gen-Probe APTIMA COMBO2 (R) Assay. Acceptable specimen types for this assay include APTIMA Swabs (Unisex, endocervical, urethral, or vaginal), first void urine, and ThinPrep liquid based cytology samples. Performed at [facility]  Urine culture     Status: None   Collection Time: 06/09/13 12:48 PM  Result Value Ref Range Status   Specimen Description URINE, RANDOM  Final   Special Requests NONE  Final   Culture  Setup Time   Final    06/09/2013 17:14 Performed at [facility] Count   Final    >=100,000 COLONIES/ML Performed at [facility]   Culture   Final    Multiple bacterial morphotypes present, none predominant. Suggest appropriate recollection if clinically indicated. Performed at [facility]   Report Status 06/11/2013 FINAL  Final    Coagulation Studies: No results for input(s): LABPROT, INR in the last 72 hours.  Imaging: Ct Head Wo Contrast  12/25/2014   CLINICAL DATA:  New onset seizure, lasted for 3 minutes  EXAM: CT HEAD WITHOUT CONTRAST  TECHNIQUE: Contiguous axial images were obtained from the base of the skull through the vertex without intravenous contrast.  COMPARISON:  None.  FINDINGS: Mild asymmetry in the lateral ventricles within normal limits of variability. Normal sulcation. Minimal calcification of the bilateral basal ganglia. No evidence of hemorrhage or mass no evidence of infarct or extra-axial fluid. Calvarium is intact. Visualized portions of the paranasal sinuses are clear.  IMPRESSION: No significant abnormalities   Electronically Signed   By: Esperanza Heir M.D.   On: 12/25/2014 21:52   Mr Laqueta Jean AO Contrast  12/26/2014   CLINICAL DATA:  Increasing frequency of panic attacks and associated seizures.  EXAM: MRI HEAD WITHOUT AND WITH CONTRAST  TECHNIQUE: Multiplanar, multiecho pulse sequences of the brain and surrounding structures were obtained without and with  intravenous contrast.  CONTRAST:  15mL MULTIHANCE GADOBENATE DIMEGLUMINE 529 MG/ML IV SOLN  COMPARISON:  CT head December 25, 2014, MRI of the brain report dated November 02, 1999 though images are not available for direct comparison.  FINDINGS: The ventricles and sulci are normal for patient's age. No abnormal parenchymal signal, mass lesions, mass effect. No abnormal parenchymal enhancement. No reduced diffusion to suggest acute ischemia. No susceptibility artifact to suggest  hemorrhage.  No abnormal extra-axial fluid collections. No extra-axial masses nor leptomeningeal enhancement. Normal major intracranial vascular flow voids seen at the skull base. Thin slice coronal T2 is markedly oblique due to patient positioning, the LEFT hippocampus appears slightly smaller than the RIGHT.  Ocular globes and orbital contents are unremarkable though not tailored for evaluation. No abnormal sellar expansion. Visualized paranasal sinuses and mastoid air cells are well-aerated. No suspicious calvarial bone marrow signal. No abnormal sellar expansion. Craniocervical junction maintained.  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 Recommend follow-up thin slice coronal T2 with improved position when patient is better able to tolerate imaging.  Otherwise unremarkable MRI of the brain with contrast.   Electronically Signed   By: Awilda Metro M.D.   On: 12/26/2014 06:05   Dg Chest Portable 1 View  12/25/2014   CLINICAL DATA:  Seizure  EXAM: PORTABLE CHEST - 1 VIEW  COMPARISON:  None.  FINDINGS: Lungs are hypoaerated with crowding of the bronchovascular markings. Leads obscure detail. Heart size is normal. No pleural effusion. No pneumothorax. No acute osseous finding.  IMPRESSION: Low volume exam with crowding of the bronchovascular markings at the bases but no focal acute finding.   Electronically Signed   By: Christiana Pellant M.D.   On: 12/25/2014 20:06    Medications:  Scheduled: . enoxaparin (LOVENOX) injection  40 mg Subcutaneous Q24H  . levETIRAcetam  500 mg Oral BID    Assessment/Plan: No further seizures while on Keppra.  Patient will need to follow up neurology in 2-3 weeks for follow up. No further work up while in hospital per neurology.  Neurology will S/O       Felicie Morn PA-C [facility] [phone]  12/27/2014, 9:24 AM

## 2015-01-04 ENCOUNTER — Encounter: Payer: Self-pay | Admitting: Family Medicine

## 2015-01-04 ENCOUNTER — Ambulatory Visit: Payer: Self-pay | Attending: Family Medicine | Admitting: Family Medicine

## 2015-01-04 VITALS — BP 109/72 | HR 95 | Temp 99.0°F | Ht 66.0 in | Wt 147.0 lb

## 2015-01-04 DIAGNOSIS — G40909 Epilepsy, unspecified, not intractable, without status epilepticus: Secondary | ICD-10-CM | POA: Insufficient documentation

## 2015-01-04 NOTE — Progress Notes (Signed)
Patient here to follow up after having a seizure She was given Keppra in the hospital with 1 refill and reports no issues in taking it She reports no pain today She does not have a follow up with neuro yet.  She states she is trying to get Medicaid before she sees the specialist

## 2015-01-04 NOTE — Patient Instructions (Signed)

## 2015-01-04 NOTE — Progress Notes (Signed)
Subjective:  Patient ID: Tracy Kelly, female    DOB: 09-10-1970  Age: 44 y.o. MRN: [id number]  CC: Hospitalization Follow-up and Seizures   HPI Tracy Kelly presents for a hospital follow up after recent hospitalization at [facility] where she was managed for newly diagnosed seizures and commenced on Keppra. Prior to presentation she was noticed to be staring into space by her husband and not responding to verbal stimuli and this had happened on previous occassions. EMS was called and she had a generalized tonic clonic associated with urinary incontinence en route.  She was admitted and loading dose of IV Keppra administered, Neurology consulted, head CT, brain MRI and EEG were unremarkable.She did have a hypokalemia of 2.9 which was replaced. Her condition stabilized and she was discharged with recommendations to follow up with Neurology in 2 -3 weeks  She feels fine today and has not had a seizure since discharge and has been compliant with Keppra. She has not made a Neurology appointment as she is waiting for medicaid approval she says.  Outpatient Prescriptions Prior to Visit  Medication Sig Dispense Refill  . levETIRAcetam (KEPPRA) 500 MG tablet Take 1 tablet (500 mg total) by mouth 2 (two) times daily. 90 tablet 0   No facility-administered medications prior to visit.    ROS Review of Systems  Constitutional: Negative for activity change, appetite change and fatigue.  HENT: Negative for congestion, sinus pressure and sore throat.   Eyes: Negative for visual disturbance.  Respiratory: Negative for cough, chest tightness, shortness of breath and wheezing.   Cardiovascular: Negative for chest pain and palpitations.  Gastrointestinal: Negative for abdominal pain, constipation and abdominal distention.  Endocrine: Negative for polydipsia.  Genitourinary: Negative for dysuria and frequency.  Musculoskeletal: Negative for back pain and arthralgias.  Skin: Negative  for rash.  Neurological: Negative for tremors, light-headedness and numbness.  Hematological: Does not bruise/bleed easily.  Psychiatric/Behavioral: Negative for behavioral problems and agitation.    Objective:  BP 109/72 mmHg  Pulse 95  Temp(Src) 99 F (37.2 C)  Ht  (1.676 m)  Wt 147 lb (66.679 kg)  BMI 23.74 kg/m2  SpO2 96%  LMP 12/16/2014  BP/Weight 01/04/2015 12/27/2014 12/26/2014  Systolic BP 109 103 -  Diastolic BP 72 62 -  Wt. (Lbs) 147 - 170.3  BMI 23.74 - 27.5    Lab Results  Component Value Date   WBC 9.9 12/26/2014   HGB 11.5* 12/26/2014   HCT 34.5* 12/26/2014   PLT 124* 12/26/2014   GLUCOSE 109* 12/26/2014   ALT 15 12/26/2014   AST 21 12/26/2014   NA 142 12/26/2014   K 3.7 12/26/2014   CL 113* 12/26/2014   CREATININE 0.56 12/26/2014   BUN 6 12/26/2014   CO2 25 12/26/2014    Physical Exam  Constitutional: She is oriented to person, place, and time. She appears well-developed and well-nourished.  Cardiovascular: Normal rate, normal heart sounds and intact distal pulses.   No murmur heard. Pulmonary/Chest: Effort normal and breath sounds normal. She has no wheezes. She has no rales. She exhibits no tenderness.  Abdominal: Soft. Bowel sounds are normal. She exhibits no distension and no mass. There is no tenderness.  Musculoskeletal: Normal range of motion.  Neurological: She is alert and oriented to person, place, and time. She displays normal reflexes. No cranial nerve deficit. Coordination normal.  Skin: Skin is warm and dry.  Psychiatric: She has a normal mood and affect.   EXAM: CT HEAD  WITHOUT CONTRAST  TECHNIQUE: Contiguous axial images were obtained from the base of the skull through the vertex without intravenous contrast.  COMPARISON: None.  FINDINGS: Mild asymmetry in the lateral ventricles within normal limits of variability. Normal sulcation. Minimal calcification of the bilateral basal ganglia. No evidence of hemorrhage or  mass no evidence of infarct or extra-axial fluid. Calvarium is intact. Visualized portions of the paranasal sinuses are clear.  IMPRESSION: No significant abnormalities   Electronically Signed  By: Esperanza Heir M.D.  On: 12/25/2014 21:52    Assessment & Plan:  44 year old female with newly diagnosed seizures currently on Keppra.  1. Seizure disorder Remains on Keppra and has had no active seizures since discharge. Neurology recommended a two to three-week follow-up but she has no insurance and is yet to obtain [facility] card discount and so information has been provided to that effect. I will see her back for follow-up at her next visit and refer her to neurology. Advised against driving.    Follow-up: Return in about 3 weeks (around 01/25/2015), or if symptoms worsen or fail to improve, for follow up on seziure with Dr Venetia Night.   Jaclyn Shaggy MD

## 2015-01-26 ENCOUNTER — Ambulatory Visit: Payer: Self-pay | Attending: Family Medicine | Admitting: Family Medicine

## 2015-01-26 ENCOUNTER — Encounter: Payer: Self-pay | Admitting: Family Medicine

## 2015-01-26 VITALS — BP 105/70 | HR 100 | Temp 99.0°F | Ht 66.0 in | Wt 146.6 lb

## 2015-01-26 DIAGNOSIS — G40909 Epilepsy, unspecified, not intractable, without status epilepticus: Secondary | ICD-10-CM | POA: Insufficient documentation

## 2015-01-26 DIAGNOSIS — R51 Headache: Secondary | ICD-10-CM | POA: Insufficient documentation

## 2015-01-26 DIAGNOSIS — Z79899 Other long term (current) drug therapy: Secondary | ICD-10-CM | POA: Insufficient documentation

## 2015-01-26 NOTE — Progress Notes (Signed)
Patient here to follow up on her seizures She states she thinks she has had one seizure since her last appointment She reports intermittent headaches in the occipital region that last for 1 minute and then go away She also reports chronic swallowing difficulty.  She states she chews her food appropriately and then there is pain while food is traveling down the esophagus.  She has to lean her head back so that the food will travel down.  This has been going on for years

## 2015-01-26 NOTE — Progress Notes (Signed)
Subjective:    Patient ID: Tracy Kelly, female    DOB: 04/14/1971, 44 y.o.   MRN: [id number]  HPI  Tracy Kelly is a 44 year old female with recent hospitalization for new onset seizures for which she was placed on Keppra. Head CT, brain MRI, EEG were all unremarkable.  She was to be referred to neurology however she is yet to obtain the [facility] and order [facility] discount. She informs me she has not had any seizures since her last office visit 3 weeks ago but told the nurse she thought she had one seizure. Denies any memory loss She endorses occipital headaches which occur intermittently and lasts from 1-2 minutes and resolved spontaneously with no associated nausea, blurry vision; she thinks headaches started when she was placed on Keppra..   Past Medical History  Diagnosis Date  . Anxiety     History reviewed. No pertinent past surgical history.  Social History   Social History  . Marital Status: Single    Spouse Name: N/A  . Number of Children: N/A  . Years of Education: N/A   Occupational History  . Not on file.   Social History Main Topics  . Smoking status: Never Smoker   . Smokeless tobacco: Not on file  . Alcohol Use: No  . Drug Use: No  . Sexual Activity: Not on file   Other Topics Concern  . Not on file   Social History Narrative    Allergies  Allergen Reactions  . Amoxicillin     hives  . Penicillins     hives    Current Outpatient Prescriptions on File Prior to Visit  Medication Sig Dispense Refill  . levETIRAcetam (KEPPRA) 500 MG tablet Take 1 tablet (500 mg total) by mouth 2 (two) times daily. 90 tablet 0   No current facility-administered medications on file prior to visit.      Review of Systems  Constitutional: Negative for activity change and appetite change.  HENT: Negative for sinus pressure and sore throat.   Eyes: Negative for visual disturbance.  Respiratory: Negative for chest tightness, shortness of breath and  wheezing.   Gastrointestinal: Negative for nausea, vomiting, abdominal pain, constipation and abdominal distention.  Genitourinary: Negative.   Musculoskeletal: Negative.   Neurological: Positive for headaches (Occipital headaches). Negative for seizures.  Psychiatric/Behavioral: Negative for behavioral problems and dysphoric mood.       Objective: Filed Vitals:   01/26/15 1524  BP: 105/70  Pulse: 100  Temp: 99 F (37.2 C)  Height:  (1.676 m)  Weight: 146 lb 9.6 oz (66.497 kg)  SpO2: 97%      Physical Exam  Constitutional: She is oriented to person, place, and time. She appears well-developed and well-nourished.  Cardiovascular: Normal rate, normal heart sounds and intact distal pulses.   No murmur heard. Pulmonary/Chest: Effort normal and breath sounds normal. She has no wheezes. She has no rales. She exhibits no tenderness.  Abdominal: Soft. Bowel sounds are normal. She exhibits no distension and no mass. There is no tenderness.  Musculoskeletal: Normal range of motion.  Neurological: She is alert and oriented to person, place, and time.          Assessment & Plan:  44 year old female with newly diagnosed seizures currently on Keppra.  1. Seizure disorder Remains on Keppra; she gave conflicting stories to the nurse and myself regarding recurrence of seizures She will still need to see neurology given this is a new onset seizure; this will be  deferred to her PCP. She has no insurance and is yet to obtain [facility] card or [employer]	[place] and so information has been provided to that effect and she is to notify the clinic so her neurology referral, replaced. Advised against driving.  2. Headaches Likely a side effect of Keppra. They last only one to two minutes and have no associated symptoms and so I have advised her to take ibuprofen as needed for the pain.

## 2015-01-26 NOTE — Patient Instructions (Signed)

## 2015-02-07 ENCOUNTER — Telehealth: Payer: Self-pay | Admitting: Family Medicine

## 2015-02-07 NOTE — Telephone Encounter (Signed)
Patient called requesting a med refill on levETIRAcetam (KEPPRA) 500 MG tablet. Please f/u with pt.

## 2015-02-09 ENCOUNTER — Other Ambulatory Visit: Payer: Self-pay | Admitting: *Deleted

## 2015-02-09 MED ORDER — LEVETIRACETAM 500 MG PO TABS: 500.0000 mg | ORAL_TABLET | Freq: Two times a day (BID) | ORAL | Status: DC

## 2015-02-09 NOTE — Telephone Encounter (Signed)
Patient presents to the clinic to check on the status of her refill for levETIRAcetam (KEPPRA) 500 MG tablet. Please follow up with pt.

## 2015-03-16 ENCOUNTER — Ambulatory Visit (INDEPENDENT_AMBULATORY_CARE_PROVIDER_SITE_OTHER): Payer: Self-pay | Admitting: Neurology

## 2015-03-16 ENCOUNTER — Encounter: Payer: Self-pay | Admitting: Neurology

## 2015-03-16 VITALS — BP 90/64 | HR 84 | Resp 16 | Wt 144.0 lb

## 2015-03-16 DIAGNOSIS — G40209 Localization-related (focal) (partial) symptomatic epilepsy and epileptic syndromes with complex partial seizures, not intractable, without status epilepticus: Secondary | ICD-10-CM

## 2015-03-16 MED ORDER — LEVETIRACETAM 500 MG PO TABS: 500.0000 mg | ORAL_TABLET | Freq: Two times a day (BID) | ORAL | Status: DC

## 2015-03-16 NOTE — Progress Notes (Signed)
NEUROLOGY CONSULTATION NOTE  Annye EnglishMelody K Krass MRN: [id number] DOB: 19-Apr-1971  Referring provider: Dr. Jaclyn ShaggyEnobong Amao Primary care provider: Dr. Jaclyn ShaggyEnobong Amao  Reason for consult:  New onset seizure  Dear Dr Venetia NightAmao:  Thank you for your kind referral of Mariea K Whilden for consultation of the above symptoms. Although her history is well known to you, please allow me to reiterate it for the purpose of our medical record. The patient was accompanied to the clinic by her husband who also provides collateral information. Records and images were personally reviewed where available.  HISTORY OF PRESENT ILLNESS: This is a pleasant 44 year old right-handed woman presenting with new onset seizure. She has no recollection of events, she recalls going to work that morning, then waking up in the hospital. She and her husband work together, and he reports that on the morning of 12/25/14, prior to going to work, he noticed her standing at the door, staring and unresponsive for around 5 seconds. They went to work where she had a panic attack and another staring episode that she is amnestic of. They got home then later on he found her sitting on the couch unresponsive, then just saying "okay" when asked questions. She then stopped talking and started having lip smacking. He called 911, and when they arrived, she continued to be staring then proceeded to have a generalized tonic-clonic seizure was head turn to the right lasting 1-2 minutes. She had 3 more seizures en route and was given Versed. She was back to baseline in the ER and was started on Keppra. Bloodwork showed normal CBC except mild thrombocytopenia with platelets of 124, CMP unremarkable, UDS positive for benzodiazepines (received Versed in ambulance). 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 Routine wake and  sleep EEG was normal.   Her husband reports that she has always had panic attacks since her 1420s. He has also been noticing staring and unresponsive episodes for the past couple of months. She had tried Paxil for anxiety in her 1220s, but felt unwell and woke up in the hospital. There have been no further seizures, staring episodes, as well as no further panic attacks/anxiety since starting Keppra 500mg  BID. She has been having mild occipital headaches with brief sharp pain since starting Keppra, with some photosensitivity. There is a little dizziness. No associated nausea/vomiting. She denies any olfactory/gustatory hallucinations, deja vu, rising epigastric sensation, focal numbness/tingling/weakness, myoclonic jerks. 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   Epilepsy Risk Factors:  She had a normal birth and early development.  There is no history of febrile convulsions, CNS infections such as meningitis/encephalitis, significant traumatic brain injury, neurosurgical procedures, or family history of seizures.  PAST MEDICAL HISTORY: Past Medical History  Diagnosis Date  . Anxiety     PAST SURGICAL HISTORY: No past surgical history on file.  MEDICATIONS: Current Outpatient Prescriptions on File Prior to Visit  Medication Sig Dispense Refill  . levETIRAcetam (KEPPRA) 500 MG tablet Take 1 tablet (500 mg total) by mouth 2 (two) times daily. 90 tablet 0   No current facility-administered medications on file prior to visit.    ALLERGIES: Allergies  Allergen Reactions  . Amoxicillin     hives  . Penicillins     hives    FAMILY HISTORY: No family history on file.  SOCIAL HISTORY: Social History  Social History  . Marital Status: Married    Spouse Name: N/A  . Number of Children: 1  . Years of Education: N/A   Occupational History  . Housekeeper    Social History Main Topics  . Smoking status: Current Every Day Smoker    Types:  Cigarettes  . Smokeless tobacco: Never Used  . Alcohol Use: 0.0 oz/week    0 Standard drinks or equivalent per week  . Drug Use: No  . Sexual Activity: Not on file   Other Topics Concern  . Not on file   Social History Narrative    REVIEW OF SYSTEMS: Constitutional: No fevers, chills, or sweats, no generalized fatigue, change in appetite Eyes: No visual changes, double vision, eye pain Ear, nose and throat: No hearing loss, ear pain, nasal congestion, sore throat Cardiovascular: No chest pain, palpitations Respiratory:  No shortness of breath at rest or with exertion, wheezes GastrointestinaI: No nausea, vomiting, diarrhea, abdominal pain, fecal incontinence Genitourinary:  No dysuria, urinary retention or frequency Musculoskeletal:  No neck pain, back pain Integumentary: No rash, pruritus, skin lesions Neurological: as above Psychiatric: No depression, insomnia, anxiety Endocrine: No palpitations, fatigue, diaphoresis, mood swings, change in appetite, change in weight, increased thirst Hematologic/Lymphatic:  No anemia, purpura, petechiae. Allergic/Immunologic: no itchy/runny eyes, nasal congestion, recent allergic reactions, rashes  PHYSICAL EXAM: Filed Vitals:   03/16/15 1338  BP: 90/64  Pulse: 84  Resp: 16   General: No acute distress Head:  Normocephalic/atraumatic Eyes: Fundoscopic exam shows bilateral sharp discs, no vessel changes, exudates, or hemorrhages Neck: supple, no paraspinal tenderness, full range of motion Back: No paraspinal tenderness Heart: regular rate and rhythm Lungs: Clear to auscultation bilaterally. Vascular: No carotid bruits. Skin/Extremities: No rash, no edema Neurological Exam: Mental status: alert and oriented to person, place, and time, no dysarthria or aphasia, Fund of knowledge is appropriate.  Recent and remote memory are intact. 3/3 delayed recall. Attention and concentration are normal.    Able to name objects and repeat  phrases. Cranial nerves: CN I: not tested CN II: pupils equal, round and reactive to light, visual fields intact, fundi unremarkable. CN III, IV, VI:  full range of motion, no nystagmus, no ptosis CN V: facial sensation intact CN VII: upper and lower face symmetric CN VIII: hearing intact to finger rub CN IX, X: gag intact, uvula midline CN XI: sternocleidomastoid and trapezius muscles intact CN XII: tongue midline Bulk & Tone: normal, no fasciculations. Motor: 5/5 throughout with no pronator drift. Sensation: intact to light touch, cold, pin, vibration and joint position sense.  No extinction to double simultaneous stimulation.  Romberg test negative Deep Tendon Reflexes: +2 throughout except for absent ankle jerks bilaterally, no ankle clonus Plantar responses: upgoing bilaterally Cerebellar: no incoordination on finger to nose, heel to shin. No dysdiadochokinesia Gait: narrow-based and steady, able to tandem walk adequately. Tremor: none  IMPRESSION: This is a pleasant 44 year old right-handed woman with a history of panic attacks/anxiety, as well as episodes of staring/unresponsiveness for the past couple of months, who had several GTCs last 12/25/14 preceded by staring/unresponsiveness and lip smacking, suggestive of temporal lobe epilepsy. 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 EEG normal. Continue Keppra  BID. The panic attacks have stopped since starting Keppra. They know to call if any episodes of staring/unresponsiveness occur, we will plan to increase Keppra at that point.  [place] driving laws were discussed with the patient, and she knows to stop driving after a seizure, until 6  months seizure-free. She will follow-up in 3 months and knows to call for any problems.   Thank you for allowing me to participate in the care of this patient. Please do not hesitate to call for any questions or concerns.   Patrcia Dolly, M.D.

## 2015-03-16 NOTE — Patient Instructions (Signed)
1. Continue Keppra 500mg  twice a day 2. Follow-up in 3 months, call for any changes in symptoms  Seizure Precautions: 1. If medication has been prescribed for you to prevent seizures, take it exactly as directed.  Do not stop taking the medicine without talking to your doctor first, even if you have not had a seizure in a long time.   2. Avoid activities in which a seizure would cause danger to yourself or to others.  Don't operate dangerous machinery, swim alone, or climb in high or dangerous places, such as on ladders, roofs, or girders.  Do not drive unless your doctor says you may.  3. If you have any warning that you may have a seizure, lay down in a safe place where you can't hurt yourself.    4.  No driving for 6 months from last seizure, [place].   Please refer to the following link on the Epilepsy Foundation of America's website for more information: http://www.epilepsyfoundation.org/answerplace/Social/driving/drivingu.cfm   5.  Maintain good sleep hygiene. Avoid alcohol.  6.  Notify your neurology if you are planning pregnancy or if you become pregnant.  7.  Contact your doctor if you have any problems that may be related to the medicine you are taking.  8.  Call 911 and bring the patient back to the ED if:        A.  The seizure lasts longer than 5 minutes.       B.  The patient doesn't awaken shortly after the seizure  C.  The patient has new problems such as difficulty seeing, speaking or moving  D.  The patient was injured during the seizure  E.  The patient has a temperature over 102 F (39C)  F.  The patient vomited and now is having trouble breathing

## 2015-03-20 ENCOUNTER — Encounter: Payer: Self-pay | Admitting: Neurology

## 2015-03-20 DIAGNOSIS — G40209 Localization-related (focal) (partial) symptomatic epilepsy and epileptic syndromes with complex partial seizures, not intractable, without status epilepticus: Secondary | ICD-10-CM | POA: Insufficient documentation

## 2015-03-21 ENCOUNTER — Ambulatory Visit: Payer: Self-pay | Attending: Family Medicine | Admitting: Family Medicine

## 2015-03-21 ENCOUNTER — Encounter: Payer: Self-pay | Admitting: Family Medicine

## 2015-03-21 VITALS — BP 100/65 | HR 74 | Temp 98.5°F | Resp 16 | Ht 66.0 in | Wt 139.0 lb

## 2015-03-21 DIAGNOSIS — H6123 Impacted cerumen, bilateral: Secondary | ICD-10-CM | POA: Insufficient documentation

## 2015-03-21 DIAGNOSIS — R569 Unspecified convulsions: Secondary | ICD-10-CM | POA: Insufficient documentation

## 2015-03-21 DIAGNOSIS — H6120 Impacted cerumen, unspecified ear: Secondary | ICD-10-CM | POA: Insufficient documentation

## 2015-03-21 DIAGNOSIS — H9319 Tinnitus, unspecified ear: Secondary | ICD-10-CM | POA: Insufficient documentation

## 2015-03-21 DIAGNOSIS — G40209 Localization-related (focal) (partial) symptomatic epilepsy and epileptic syndromes with complex partial seizures, not intractable, without status epilepticus: Secondary | ICD-10-CM | POA: Insufficient documentation

## 2015-03-21 DIAGNOSIS — H9313 Tinnitus, bilateral: Secondary | ICD-10-CM | POA: Insufficient documentation

## 2015-03-21 MED ORDER — CARBAMIDE PEROXIDE 6.5 % OT SOLN: 5.0000 [drp] | Freq: Two times a day (BID) | OTIC | Status: DC

## 2015-03-21 NOTE — Progress Notes (Signed)
Patient ID: Tracy Kelly Retter, female   DOB: [birth year], 44 y.o.   MRN: [id number]   Subjective:    Patient ID: Tracy Kelly Wojtaszek, female    DOB: [birth year], 44 y.o.   MRN: [id number] Chief Complaint  Patient presents with  . Seizures  . Tinnitus    HPI   1. Seizures: remains seizure free. Compliant with keppra. Intermittent occipital headaches. Resolves with rest and time.  Longstanding anxiety has resolved with keppra. Was evaluated by neurology on 03/16/2015. Plan is to continue keppra, no driving until 6 months seizure free, f/u in 3 months.   2. Tinnitus: chronic b/l tinnitus. Has hx of recurrent ear infections. Has decreased hearing in R ear. Has difficulty following conversations in noisy environments. No ear pain.   Social History  Substance Use Topics  . Smoking status: Current Every Day Smoker    Types: Cigarettes  . Smokeless tobacco: Never Used  . Alcohol Use: 0.0 oz/week    0 Standard drinks or equivalent per week    Current Outpatient Prescriptions on File Prior to Visit  Medication Sig Dispense Refill  . levETIRAcetam (KEPPRA) 500 MG tablet Take 1 tablet (500 mg total) by mouth 2 (two) times daily. 60 tablet 11   No current facility-administered medications on file prior to visit.    Review of Systems  Constitutional: Negative for activity change and appetite change.  HENT: Positive for hearing loss and tinnitus. Negative for ear discharge, ear pain, sinus pressure and sore throat.   Eyes: Negative for visual disturbance.  Respiratory: Negative for chest tightness, shortness of breath and wheezing.   Gastrointestinal: Negative for nausea, vomiting, abdominal pain, constipation and abdominal distention.  Genitourinary: Negative.   Musculoskeletal: Negative.   Neurological: Positive for headaches (Occipital headaches). Negative for seizures.  Psychiatric/Behavioral: Negative for behavioral problems and dysphoric mood.      Objective: Filed Vitals:   03/21/15  1024  BP: 100/65  Pulse: 74  Temp: 98.5 F (36.9 C)  TempSrc: Oral  Resp: 16  Height: 5\' 6"  (1.676 m)  Weight: 139 lb (63.05 kg)  SpO2: 99%      Physical Exam  Constitutional: She is oriented to person, place, and time. She appears well-developed and well-nourished.  HENT:  Cerumen impacting both canals   Cardiovascular: Normal rate, normal heart sounds and intact distal pulses.   No murmur heard. Pulmonary/Chest: Effort normal and breath sounds normal. She has no wheezes. She has no rales. She exhibits no tenderness.  Abdominal: Soft. Bowel sounds are normal. She exhibits no distension and no mass. There is no tenderness.  Musculoskeletal: Normal range of motion.  Neurological: She is alert and oriented to person, place, and time.       Assessment & Plan:   Sonni was seen today for seizures and tinnitus.  Diagnoses and all orders for this visit:  Cerumen impaction, bilateral -     carbamide peroxide (DEBROX) 6.5 % otic solution; Place 5 drops into both ears 2 (two) times daily. For 5 days  Tinnitus, bilateral -     Ambulatory referral to Audiology  Localization-related (focal) (partial) symptomatic epilepsy and epileptic syndromes with complex partial seizures, not intractable, without status epilepticus (HCC)

## 2015-03-21 NOTE — Progress Notes (Signed)
F/U seizures  [place] 3 month ago, no injury  No pain today  Tobacco user 3 cigarette per day  No Suicide thought in the past two weeks

## 2015-03-21 NOTE — Assessment & Plan Note (Signed)
Referral to audiology.

## 2015-03-21 NOTE — Assessment & Plan Note (Signed)
A: remains seizure free.  P; Continue f/u with neurology and recommendations

## 2015-03-21 NOTE — Assessment & Plan Note (Signed)
Irrigation done in office today

## 2015-03-21 NOTE — Patient Instructions (Signed)
Tracy Kelly was seen today for seizures and tinnitus.  Diagnoses and all orders for this visit:  Cerumen impaction, bilateral -     carbamide peroxide (DEBROX) 6.5 % otic solution; Place 5 drops into both ears 2 (two) times daily. For 5 days  Tinnitus, bilateral -     Ambulatory referral to Audiology  Localization-related (focal) (partial) symptomatic epilepsy and epileptic syndromes with complex partial seizures, not intractable, without status epilepticus (HCC)   F/u in 3 months   Dr. Armen PickupFunches

## 2015-04-27 ENCOUNTER — Ambulatory Visit: Payer: Medicaid Other | Admitting: Family Medicine

## 2015-05-01 MED FILL — levETIRAcetam 500 MG TABS: 500 | 30 days supply | Qty: 60 | Fill #1

## 2015-05-21 ENCOUNTER — Ambulatory Visit: Payer: Medicaid Other | Attending: Family Medicine | Admitting: Audiology

## 2015-05-21 DIAGNOSIS — R94120 Abnormal auditory function study: Secondary | ICD-10-CM

## 2015-05-21 DIAGNOSIS — H9193 Unspecified hearing loss, bilateral: Secondary | ICD-10-CM | POA: Insufficient documentation

## 2015-05-21 DIAGNOSIS — H748X2 Other specified disorders of left middle ear and mastoid: Secondary | ICD-10-CM | POA: Insufficient documentation

## 2015-05-21 DIAGNOSIS — R94128 Abnormal results of other function studies of ear and other special senses: Secondary | ICD-10-CM | POA: Insufficient documentation

## 2015-05-21 DIAGNOSIS — H9319 Tinnitus, unspecified ear: Secondary | ICD-10-CM | POA: Insufficient documentation

## 2015-05-21 DIAGNOSIS — Z01118 Encounter for examination of ears and hearing with other abnormal findings: Secondary | ICD-10-CM | POA: Insufficient documentation

## 2015-05-21 DIAGNOSIS — H906 Mixed conductive and sensorineural hearing loss, bilateral: Secondary | ICD-10-CM

## 2015-05-21 NOTE — Procedures (Signed)
Outpatient Audiology and [facility]  [address]  [place], [place] [address]  [phone]   Audiological Evaluation  Patient Name: Tracy Kelly   Status: Outpatient   DOB: 01-03-1971    Diagnosis: Tinnitus, hearing loss MRN: [id number] Date:  05/21/2015     Referent: Lora Paula, MD  History:  Sheril K Parran was seen for an audiological evaluation.  She reports long standing hearing issues and tinnitus.  Significant is that she "had (her first) seizure 4-5 months ago" and has been "on Keppra since".  She reports a significant history of "ear infections with a couple of sets of tubes as a young child per Dr. [facility]".  Shatiqua K Kirsten state that she had trouble hearing in elementary school "if the teacher looked the other way when she spoke".  Cindie K Kampa states that she has "difficulty hearing her husband at home" especially if he is turned away from her or is "speaking from another room".  There is no family history of hearing loss that Isaiah K Stuard is aware of. The tinnitus is "bothersome to her".  On a scale of 1-10 she reports the tinnitus annoyance as a "5".  Karlea K Elderkin states that sometime the "tinnitus is in one ear" and "sometimes it is in both ears".    Evaluation: Conventional pure tone audiometry from  -  with using insert earphones.  Hearing thresholds are symmetrical and are 60 dBHL from  - ; 35-40 dBHL at ; 15-20 dBHL at  and 30 dBHL at  bilaterally.  The hearing loss is mixed bilaterally.  Speech reception levels (repeating words near threshold) using recorded spondee word lists:  Right ear: 35 dBHL.  Left ear:  40 dBHL Word recognition (at comfortably loud volumes) using recorded word lists at 70 dBHL with 65 dBHL contralateral masking, in quiet.  Right ear: 76%.  Left ear:   84% Tympanometry (middle ear function) with ipsilateral acoustic reflexes.  Right ear: Normal (Type A) with  present acoustic reflex of 90dB from  - .  Left ear: Normal pressure with excessive compliance (Type Ad) with 95 dBHL at  and 85-90dB from  - . Acoustic reflex decay is negative on the left side and negative on the right side using  at 105 dB. Tinnitus matching appeared to be approximately  at 62  dBHL.    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 The hearing loss is mixed bilaterally.  This amount of hearing loss would adversely affect speech communication at normal conversational speech levels. Further evaluation by an ENT and a hearing aid evaluation is recommended.  Word recognition is fair in the right ear and good in the left ear in quiet at conversational speech levels bilaterally.  Middle ear function is within normal limits for volume and pressure bilaterally but the left ear has excessive compliance.  Acoustic reflexes are present and mostly within normal limits bilaterally except for a slightly elevated responses at  which is not considered significant.  There is a significant amount of non-occluding ear wax bilaterally obscuring the view of the tympanic membrane.      The tinnitus is measured at volumes equivalent to conversational speech levels. Of concern is that the tinnitus is reported to be bothersome so that treatment for the tinnitus and/or a tinnitus masker evaluation is recommended.  Please be aware that  Vocational Rehabilitation may be helpful for Reizy K Funari getting hearing aids and/or with keeping employed because of the hearing loss.  RECOMMENDATIONS: 1.   Monitor hearing closely with a repeat audiological evaluation in 2-3 months (earlier if there is any change in hearing or ear pressure) - here or at the ENT office.  2.   Further evaluation of the tinnitus and hearing loss by an Ear, Nose and Throat physician is strongly  recommended. In addition a hearing aid evaluation with evaluation for tinnitus treatment is strongly recommended. 3. To minimize the adverse effects of tinnitus 1) avoid quiet  2) use noise maskers at home such as a sound machine, quiet music, a fan or other background noise at a volume just loud enough to mask the high pitched tinnitus. 3) If the tinnitus becomes more bothersome, adversely affecting your sleep or concentration, contact your physician,  seek additional medical help by an ENT for further treatment of your tinnitus. 4.  Strategies that help improve hearing include: A) Face the speaker directly. Optimal is having the speakers face well - lit.  Unless amplified, being within 3-6 feet of the speaker will enhance word recognition. B) Avoid having the speaker back-lit as this will minimize the ability to use cues from lip-reading, facial expression and gestures. C)  Word recognition is poorer in background noise. For optimal word recognition, turn off the TV, radio or noisy fan when engaging in conversation. In a restaurant, try to sit away from noise sources and close to the primary speaker.  D)  Ask for topic clarification from time to time in order to remain in the conversation.  Most people don't mind repeating or clarifying a point when asked.  If needed, explain the difficulty hearing in background noise or hearing loss. 5.   Use hearing protection during noisy activities such as using a noisy vaccuum, weed eater, moving the lawn, shooting, etc.    Musician's plugs, are available from [employer] for music related hearing protection because there is no distortion.  Other hearing protection, such as sponge plugs (available at pharmacies) or earmuffs (available at sporting goods stores or department stores such as [employer]) are useful for noisy activities and venues.  Nyasha Rahilly L. Kate Sable, Au.D., CCC-A Doctor of Audiology 05/21/2015   cc: Lora Paula, MD

## 2015-06-01 MED FILL — levETIRAcetam 500 MG TABS: 500 | 30 days supply | Qty: 60 | Fill #2

## 2015-07-03 MED FILL — levETIRAcetam 500 MG TABS: 500 | 30 days supply | Qty: 60 | Fill #3

## 2015-07-11 ENCOUNTER — Ambulatory Visit (INDEPENDENT_AMBULATORY_CARE_PROVIDER_SITE_OTHER): Payer: Medicaid Other | Admitting: Neurology

## 2015-07-11 ENCOUNTER — Encounter: Payer: Self-pay | Admitting: Neurology

## 2015-07-11 VITALS — BP 96/64 | HR 85 | Resp 16 | Wt 147.0 lb

## 2015-07-11 DIAGNOSIS — G40209 Localization-related (focal) (partial) symptomatic epilepsy and epileptic syndromes with complex partial seizures, not intractable, without status epilepticus: Secondary | ICD-10-CM

## 2015-07-11 MED ORDER — LEVETIRACETAM 500 MG PO TABS: 500.0000 mg | ORAL_TABLET | Freq: Two times a day (BID) | ORAL | Status: DC

## 2015-07-11 NOTE — Progress Notes (Signed)
NEUROLOGY FOLLOW UP OFFICE NOTE  Tracy Kelly [id number]  HISTORY OF PRESENT ILLNESS: I had the pleasure of seeing Tracy Kelly in follow-up in the neurology clinic on 07/11/2015. She is again accompanied by her husband who helps supplement the history today. The patient was last seen 4 months for new onset seizures in August 2016. 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 Routine EEG normal. She was started on Keppra 500mg  BID and has been doing well since then with no further seizures. She has a history of panic attacks since her 2720s, these have completely resolved with initiation of Keppra. She and her husband deny any staring/unresponsive episodes, gaps in time, olfactory/gustatory hallucinations, deja vu, rising epigastric sensation, focal numbness/tingling/weakness, myoclonic jerks. She denies any significant headaches, dizziness, vision changes. She has been having sleep difficulties, mostly with sleep maintenance. She denies any alcohol intake.   HPI: This is a pleasant 45 yo RH woman with new onset seizure last 12/25/14. She has no recollection of events, she recalls going to work that morning, then waking up in the hospital. She and her husband work together, and he reports that on the morning of 12/25/14, prior to going to work, he noticed her standing at the door, staring and unresponsive for around 5 seconds. They went to work where she had a panic attack and another staring episode that she is amnestic of. They got home then later on he found her sitting on the couch unresponsive, then just saying "okay" when asked questions. She then stopped talking and started having lip smacking. He called 911, and when they arrived, she continued to be staring then proceeded to have a generalized tonic-clonic seizure was head turn to the right lasting 1-2 minutes. She had 3 more seizures en route and was given Versed. She was back to baseline in the ER and was started on Keppra.  Bloodwork showed normal CBC except mild thrombocytopenia with platelets of 124, CMP unremarkable, UDS positive for benzodiazepines (received Versed in ambulance). 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 Routine wake and sleep EEG was normal.   Her husband reports that she has always had panic attacks since her 4720s. He has also been noticing staring and unresponsive episodes for the past couple of months. She had tried Paxil for anxiety in her 3220s, but felt unwell and woke up in the hospital. There have been no further seizures, staring episodes, as well as no further panic attacks/anxiety since starting Keppra 500mg  BID. 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   Epilepsy Risk Factors: She had a normal birth and early development. There is no history of febrile convulsions, CNS infections such as meningitis/encephalitis, significant traumatic brain injury, neurosurgical procedures, or family history of seizures.  PAST MEDICAL HISTORY: Past Medical History  Diagnosis Date  . Anxiety     MEDICATIONS: Current Outpatient Prescriptions on File Prior to Visit  Medication Sig Dispense Refill  . levETIRAcetam (KEPPRA) 500 MG tablet Take 1 tablet (500 mg total) by mouth 2 (two) times daily. 60 tablet 11   No current facility-administered medications on file prior to visit.    ALLERGIES: Allergies  Allergen Reactions  . Amoxicillin     hives  . Penicillins     hives    FAMILY HISTORY: No family history on file.  SOCIAL HISTORY: Social  History   Social History  . Marital Status: Married    Spouse Name: N/A  . Number of Children: 1  . Years of Education: N/A   Occupational History  . Housekeeper    Social History Main Topics  . Smoking status: Current Every Day Smoker    Types: Cigarettes    . Smokeless tobacco: Never Used  . Alcohol Use: 0.0 oz/week    0 Standard drinks or equivalent per week  . Drug Use: No  . Sexual Activity: Not on file   Other Topics Concern  . Not on file   Social History Narrative    REVIEW OF SYSTEMS: Constitutional: No fevers, chills, or sweats, no generalized fatigue, change in appetite Eyes: No visual changes, double vision, eye pain Ear, nose and throat: No hearing loss, ear pain, nasal congestion, sore throat Cardiovascular: No chest pain, palpitations Respiratory:  No shortness of breath at rest or with exertion, wheezes GastrointestinaI: No nausea, vomiting, diarrhea, abdominal pain, fecal incontinence Genitourinary:  No dysuria, urinary retention or frequency Musculoskeletal:  No neck pain, back pain Integumentary: No rash, pruritus, skin lesions Neurological: as above Psychiatric: No depression, insomnia, anxiety Endocrine: No palpitations, fatigue, diaphoresis, mood swings, change in appetite, change in weight, increased thirst Hematologic/Lymphatic:  No anemia, purpura, petechiae. Allergic/Immunologic: no itchy/runny eyes, nasal congestion, recent allergic reactions, rashes  PHYSICAL EXAM: Filed Vitals:   07/11/15 1456  BP: 96/64  Pulse: 85  Resp: 16   General: No acute distress Head:  Normocephalic/atraumatic Neck: supple, no paraspinal tenderness, full range of motion Heart:  Regular rate and rhythm Lungs:  Clear to auscultation bilaterally Back: No paraspinal tenderness Skin/Extremities: No rash, no edema Neurological Exam: alert and oriented to person, place, and time. No aphasia or dysarthria. Fund of knowledge is appropriate.  Recent and remote memory are intact. 3/3 delayed recall.  Attention and concentration are normal.    Able to name objects and repeat phrases. Cranial nerves: Pupils equal, round, reactive to light. Extraocular movements intact with no nystagmus. Visual fields full. Facial sensation intact. No  facial asymmetry. Tongue, uvula, palate midline.  Motor: Bulk and tone normal, muscle strength 5/5 throughout with no pronator drift.  Sensation to light touch intact.  No extinction to double simultaneous stimulation.  Deep tendon reflexes 2+ throughout, toes downgoing.  Finger to nose testing intact.  Gait narrow-based and steady, able to tandem walk adequately.  Romberg negative.  IMPRESSION: This is a pleasant 45 yo RH woman with a history of panic attacks/anxiety, as well as episodes of staring/unresponsiveness, who had several GTCs last 12/25/14 preceded by staring/unresponsiveness and lip smacking, suggestive of temporal lobe epilepsy. 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 EEG normal. She has been doing well with no further seizures or panic attacks since starting Keppra  BID. No side effects on Keppra. Main concern today is sleep, she will try melatonin and improve sleep hygiene. She does not drive.  [place] driving laws were discussed with the patient, and she knows to stop driving after a seizure, until 6 months seizure-free. She will follow-up in 6 months and knows to call for any problems.   Thank you for allowing me to participate in her care.  Please do not hesitate to call for any questions or concerns.  The duration of this appointment visit was 15 minutes of face-to-face time with the patient.  Greater than 50% of this time was spent in counseling, explanation of diagnosis, planning of further management, and coordination of care.  Patrcia Dolly, M.D.   CC: Dr. Armen Pickup

## 2015-07-11 NOTE — Patient Instructions (Addendum)
1. Continue Keppra 500mg  twice a day 2. Try melatonin 3mg  at bedtime for sleep 3. Follow-up in 6 months  Seizure Precautions: 1. If medication has been prescribed for you to prevent seizures, take it exactly as directed.  Do not stop taking the medicine without talking to your doctor first, even if you have not had a seizure in a long time.   2. Avoid activities in which a seizure would cause danger to yourself or to others.  Don't operate dangerous machinery, swim alone, or climb in high or dangerous places, such as on ladders, roofs, or girders.  Do not drive unless your doctor says you may.  3. If you have any warning that you may have a seizure, lay down in a safe place where you can't hurt yourself.    4.  No driving for 6 months from last seizure, [place].   Please refer to the following link on the Epilepsy Foundation of America's website for more information: http://www.epilepsyfoundation.org/answerplace/Social/driving/drivingu.cfm   5.  Maintain good sleep hygiene. Avoid alcohol.  6.  Notify your neurology if you are planning pregnancy or if you become pregnant.  7.  Contact your doctor if you have any problems that may be related to the medicine you are taking.  8.  Call 911 and bring the patient back to the ED if:        A.  The seizure lasts longer than 5 minutes.       B.  The patient doesn't awaken shortly after the seizure  C.  The patient has new problems such as difficulty seeing, speaking or moving  D.  The patient was injured during the seizure  E.  The patient has a temperature over 102 F (39C)  F.  The patient vomited and now is having trouble breathing

## 2015-08-03 MED FILL — levETIRAcetam 500 MG TABS: 500 | 30 days supply | Qty: 60 | Fill #4

## 2015-08-09 ENCOUNTER — Ambulatory Visit: Payer: Medicaid Other | Admitting: Family Medicine

## 2015-09-03 MED FILL — ?LEVETIRACETAM 500 MG TABLE: 500 | 30 days supply | Qty: 60 | Fill #5

## 2015-10-03 MED FILL — levETIRAcetam 500 MG TABS: 500 | 30 days supply | Qty: 60 | Fill #6

## 2015-11-02 MED FILL — ?LEVETIRACETAM 500 MG TABLE: 500 | 30 days supply | Qty: 60 | Fill #7

## 2015-12-03 MED FILL — levETIRAcetam 500 MG TABS: 500 | 30 days supply | Qty: 60 | Fill #8

## 2016-01-02 MED FILL — ?LEVETIRACETAM 500 MG TABLE: 500 | 30 days supply | Qty: 60 | Fill #9

## 2016-01-04 ENCOUNTER — Telehealth: Payer: Self-pay | Admitting: Family Medicine

## 2016-01-04 DIAGNOSIS — G40209 Localization-related (focal) (partial) symptomatic epilepsy and epileptic syndromes with complex partial seizures, not intractable, without status epilepticus: Secondary | ICD-10-CM

## 2016-01-04 MED ORDER — LEVETIRACETAM 500 MG PO TABS: 500.0000 mg | ORAL_TABLET | Freq: Two times a day (BID) | ORAL | 0 refills | Status: DC

## 2016-01-04 NOTE — Telephone Encounter (Signed)
Patient needs Keppra sent temporarily to [facility] [address]    Please follow up

## 2016-01-04 NOTE — Telephone Encounter (Signed)
[facility] day supply [facility] to [facility] 

## 2016-01-18 ENCOUNTER — Ambulatory Visit: Payer: Medicaid Other | Admitting: Neurology

## 2016-01-28 ENCOUNTER — Encounter: Payer: Self-pay | Admitting: Neurology

## 2016-01-28 ENCOUNTER — Ambulatory Visit (INDEPENDENT_AMBULATORY_CARE_PROVIDER_SITE_OTHER): Payer: Self-pay | Admitting: Neurology

## 2016-01-28 VITALS — BP 108/64 | Temp 98.3°F | Ht 66.0 in | Wt 140.0 lb

## 2016-01-28 DIAGNOSIS — G40209 Localization-related (focal) (partial) symptomatic epilepsy and epileptic syndromes with complex partial seizures, not intractable, without status epilepticus: Secondary | ICD-10-CM

## 2016-01-28 MED ORDER — LEVETIRACETAM 500 MG PO TABS: 500.0000 mg | ORAL_TABLET | Freq: Two times a day (BID) | ORAL | 11 refills | Status: DC

## 2016-01-28 NOTE — Progress Notes (Signed)
NEUROLOGY FOLLOW UP OFFICE NOTE  Tracy Kelly [id number]  HISTORY OF PRESENT ILLNESS: I had the pleasure of seeing Tracy Kelly in follow-up in the neurology clinic on 01/28/2016. She is again accompanied by her husband who helps supplement the history today. The patient was last seen 7 months for new onset seizures in August 2016. 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 Routine EEG normal. She is taking Keppra 500mg  BID with no further seizures or seizure-like symptoms. She has a history of panic attacks since her 4320s, these have completely resolved with initiation of Keppra. She and her husband deny any staring/unresponsive episodes, gaps in time, olfactory/gustatory hallucinations, deja vu, rising epigastric sensation, focal numbness/tingling/weakness, myoclonic jerks. She denies any significant headaches, vision changes, neck/back pain. No side effects on Keppra. She occasionally feels dizzy upon standing, no falls or loss of consciousness. She does not drive.  HPI: This is a pleasant 45 yo RH woman with new onset seizure last 12/25/14. She has no recollection of events, she recalls going to work that morning, then waking up in the hospital. She and her husband work together, and he reports that on the morning of 12/25/14, prior to going to work, he noticed her standing at the door, staring and unresponsive for around 5 seconds. They went to work where she had a panic attack and another staring episode that she is amnestic of. They got home then later on he found her sitting on the couch unresponsive, then just saying "okay" when asked questions. She then stopped talking and started having lip smacking. He called 911, and when they arrived, she continued to be staring then proceeded to have a generalized tonic-clonic seizure was head turn to the right lasting 1-2 minutes. She had 3 more seizures en route and was given Versed. She was back to baseline in the ER and was started on  Keppra. Bloodwork showed normal CBC except mild thrombocytopenia with platelets of 124, CMP unremarkable, UDS positive for benzodiazepines (received Versed in ambulance). 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 Routine wake and sleep EEG was normal.   Her husband reports that she has always had panic attacks since her 220s. He has also been noticing staring and unresponsive episodes for the past couple of months. She had tried Paxil for anxiety in her 4020s, but felt unwell and woke up in the hospital. There have been no further seizures, staring episodes, as well as no further panic attacks/anxiety since starting Keppra 500mg  BID. 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   Epilepsy Risk Factors: She had a normal birth and early development. There is no history of febrile convulsions, CNS infections such as meningitis/encephalitis, significant traumatic brain injury, neurosurgical procedures, or family history of seizures.  PAST MEDICAL HISTORY: Past Medical History:  Diagnosis Date  . Anxiety     MEDICATIONS: Current Outpatient Prescriptions on File Prior to Visit  Medication Sig Dispense Refill  . levETIRAcetam (KEPPRA) 500 MG tablet Take 1 tablet (500 mg total) by mouth 2 (two) times daily. 60 tablet 0   No current facility-administered medications on file prior to visit.     ALLERGIES: Allergies  Allergen Reactions  . Amoxicillin     hives  . Penicillins     hives    FAMILY HISTORY: No family history on file.  SOCIAL  HISTORY: Social History   Social History  . Marital status: Married    Spouse name: N/A  . Number of children: 1  . Years of education: N/A   Occupational History  . Housekeeper    Social History Main Topics  . Smoking status: Current Every Day Smoker    Types:  Cigarettes  . Smokeless tobacco: Never Used  . Alcohol use 0.0 oz/week  . Drug use: No  . Sexual activity: Not on file   Other Topics Concern  . Not on file   Social History Narrative  . No narrative on file    REVIEW OF SYSTEMS: Constitutional: No fevers, chills, or sweats, no generalized fatigue, change in appetite Eyes: No visual changes, double vision, eye pain Ear, nose and throat: No hearing loss, ear pain, nasal congestion, sore throat Cardiovascular: No chest pain, palpitations Respiratory:  No shortness of breath at rest or with exertion, wheezes GastrointestinaI: No nausea, vomiting, diarrhea, abdominal pain, fecal incontinence Genitourinary:  No dysuria, urinary retention or frequency Musculoskeletal:  No neck pain, back pain Integumentary: No rash, pruritus, skin lesions Neurological: as above Psychiatric: No depression, insomnia, anxiety Endocrine: No palpitations, fatigue, diaphoresis, mood swings, change in appetite, change in weight, increased thirst Hematologic/Lymphatic:  No anemia, purpura, petechiae. Allergic/Immunologic: no itchy/runny eyes, nasal congestion, recent allergic reactions, rashes  PHYSICAL EXAM: Vitals:   01/28/16 0854  BP: 108/64  Temp: 98.3 F (36.8 C)   General: No acute distress Head:  Normocephalic/atraumatic Neck: supple, no paraspinal tenderness, full range of motion Heart:  Regular rate and rhythm Lungs:  Clear to auscultation bilaterally Back: No paraspinal tenderness Skin/Extremities: No rash, no edema Neurological Exam: alert and oriented to person, place, and time. No aphasia or dysarthria. Fund of knowledge is appropriate.  Recent and remote memory are intact. 3/3 delayed recall.  Attention and concentration are normal.    Able to name objects and repeat phrases. Cranial nerves: Pupils equal, round, reactive to light. Extraocular movements intact with no nystagmus. Visual fields full. Facial sensation intact. No facial  asymmetry. Tongue, uvula, palate midline.  Motor: Bulk and tone normal, muscle strength 5/5 throughout with no pronator drift.  Sensation to light touch intact.  No extinction to double simultaneous stimulation.  Deep tendon reflexes 2+ throughout, toes downgoing.  Finger to nose testing intact.  Gait narrow-based and steady, able to tandem walk adequately.  Romberg negative.  IMPRESSION: This is a pleasant 45 yo RH woman with a history of panic attacks/anxiety, as well as episodes of staring/unresponsiveness, who had several GTCs last 12/25/14 preceded by staring/unresponsiveness and lip smacking, suggestive of temporal lobe epilepsy. 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 EEG normal. She has been doing well with no further seizures or panic attacks since starting Keppra 500mg  BID. No side effects on Keppra. Refills sent today. She does not drive and is aware of [place] driving laws to stop driving after a seizure, until 6 months seizure-free. She will follow-up in 1 year and knows to call for any problems.   Thank you for allowing me to participate in her care.  Please do not hesitate to call for any questions or concerns.  The duration of this appointment visit was 15 minutes of face-to-face time with the patient.  Greater than 50% of this time was spent in counseling, explanation of diagnosis, planning of further management, and coordination of care.   Patrcia Dolly, M.D.   CC: Dr. Armen Pickup

## 2016-01-28 NOTE — Patient Instructions (Signed)
1. Continue Keppra 500mg twice a day 2. Follow-up in 1 year, call for any changes  Seizure Precautions: 1. If medication has been prescribed for you to prevent seizures, take it exactly as directed.  Do not stop taking the medicine without talking to your doctor first, even if you have not had a seizure in a long time.   2. Avoid activities in which a seizure would cause danger to yourself or to others.  Don't operate dangerous machinery, swim alone, or climb in high or dangerous places, such as on ladders, roofs, or girders.  Do not drive unless your doctor says you may.  3. If you have any warning that you may have a seizure, lay down in a safe place where you can't hurt yourself.    4.  No driving for 6 months from last seizure, as per [place] state law.   Please refer to the following link on the Epilepsy Foundation of America's website for more information: http://www.epilepsyfoundation.org/answerplace/Social/driving/drivingu.cfm   5.  Maintain good sleep hygiene. Avoid alcohol.  6.  Notify your neurology if you are planning pregnancy or if you become pregnant.  7.  Contact your doctor if you have any problems that may be related to the medicine you are taking.  8.  Call 911 and bring the patient back to the ED if:        A.  The seizure lasts longer than 5 minutes.       B.  The patient doesn't awaken shortly after the seizure  C.  The patient has new problems such as difficulty seeing, speaking or moving  D.  The patient was injured during the seizure  E.  The patient has a temperature over 102 F (39C)  F.  The patient vomited and now is having trouble breathing         

## 2016-02-08 MED FILL — levETIRAcetam 500 MG TABS: 500 | 30 days supply | Qty: 60 | Fill #0

## 2016-03-06 MED FILL — levETIRAcetam 500 MG TABS: 500 | 30 days supply | Qty: 60 | Fill #1

## 2016-04-08 MED FILL — ?LEVETIRACETAM 500 MG TABLE: 500 | 30 days supply | Qty: 60 | Fill #2

## 2016-05-08 MED FILL — levETIRAcetam 500 MG TABS: 500 | 30 days supply | Qty: 60 | Fill #3

## 2016-06-04 MED FILL — levETIRAcetam 500 MG TABS: 500 | 30 days supply | Qty: 60 | Fill #4

## 2016-07-08 MED FILL — ?LEVETIRACETAM 500 MG TABLE: 500 | 30 days supply | Qty: 60 | Fill #5

## 2016-08-06 MED FILL — ?LEVETIRACETAM 500 MG TABLE: 500 | 30 days supply | Qty: 60 | Fill #6

## 2016-09-08 MED FILL — ?LEVETIRACETAM 500 MG TABLE: 500 | 30 days supply | Qty: 60 | Fill #7

## 2016-09-10 ENCOUNTER — Encounter: Payer: Self-pay | Admitting: Family Medicine

## 2016-10-08 MED FILL — ?LEVETIRACETAM 500 MG TABLE: 500 | 30 days supply | Qty: 60 | Fill #8

## 2016-11-11 MED FILL — ?LEVETIRACETAM 500 MG TABLE: 500 | 30 days supply | Qty: 60 | Fill #9

## 2016-12-08 MED FILL — ?LEVETIRACETAM 500 MG TABLE: 500 | 30 days supply | Qty: 60 | Fill #10

## 2017-01-07 ENCOUNTER — Other Ambulatory Visit: Payer: Self-pay | Admitting: Neurology

## 2017-01-07 DIAGNOSIS — G40209 Localization-related (focal) (partial) symptomatic epilepsy and epileptic syndromes with complex partial seizures, not intractable, without status epilepticus: Secondary | ICD-10-CM

## 2017-01-07 MED FILL — ?LEVETIRACETAM 500 MG TABLE: 500 | 30 days supply | Qty: 60 | Fill #11

## 2017-01-27 ENCOUNTER — Encounter: Payer: Self-pay | Admitting: Neurology

## 2017-01-27 ENCOUNTER — Ambulatory Visit (INDEPENDENT_AMBULATORY_CARE_PROVIDER_SITE_OTHER): Payer: Self-pay | Admitting: Neurology

## 2017-01-27 VITALS — BP 108/72 | HR 90 | Ht 66.0 in

## 2017-01-27 DIAGNOSIS — G40209 Localization-related (focal) (partial) symptomatic epilepsy and epileptic syndromes with complex partial seizures, not intractable, without status epilepticus: Secondary | ICD-10-CM

## 2017-01-27 MED ORDER — LEVETIRACETAM 500 MG PO TABS
500.0000 mg | ORAL_TABLET | Freq: Two times a day (BID) | ORAL | 11 refills | Status: DC
Start: 2017-01-27 — End: 2018-01-26

## 2017-01-27 NOTE — Patient Instructions (Signed)
1. Continue Keppra 500mg twice a day 2. Follow-up in 1 year, call for any changes  Seizure Precautions: 1. If medication has been prescribed for you to prevent seizures, take it exactly as directed.  Do not stop taking the medicine without talking to your doctor first, even if you have not had a seizure in a long time.   2. Avoid activities in which a seizure would cause danger to yourself or to others.  Don't operate dangerous machinery, swim alone, or climb in high or dangerous places, such as on ladders, roofs, or girders.  Do not drive unless your doctor says you may.  3. If you have any warning that you may have a seizure, lay down in a safe place where you can't hurt yourself.    4.  No driving for 6 months from last seizure, as per [place] state law.   Please refer to the following link on the [employer]'s website for more information: http://www.epilepsyfoundation.org/answerplace/Social/driving/drivingu.cfm   5.  Maintain good sleep hygiene. Avoid alcohol.  6.  Notify your neurology if you are planning pregnancy or if you become pregnant.  7.  Contact your doctor if you have any problems that may be related to the medicine you are taking.  8.  Call 911 and bring the patient back to the ED if:        A.  The seizure lasts longer than 5 minutes.       B.  The patient doesn't awaken shortly after the seizure  C.  The patient has new problems such as difficulty seeing, speaking or moving  D.  The patient was injured during the seizure  E.  The patient has a temperature over 102 F (39C)  F.  The patient vomited and now is having trouble breathing         

## 2017-01-27 NOTE — Progress Notes (Signed)
NEUROLOGY FOLLOW UP OFFICE NOTE  Tracy Kelly [id number]  HISTORY OF PRESENT ILLNESS: I had the pleasure of seeing Tracy Kelly in follow-up in the neurology clinic on 01/27/2017. She is again accompanied by her husband who helps supplement the history today. The patient was last seen a year ago for new onset seizures in August 2016. 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 Routine EEG normal. She continues to take Keppra  BID with no further seizures or seizure-like symptoms. She has a history of panic attacks since her 44s, these had significantly improved with initiation of Keppra. She and her husband report a couple of panic attacks in the past year, provoked by family issues. They deny any staring/unresponsive episodes, gaps in time, olfactory/gustatory hallucinations, deja vu, rising epigastric sensation, focal numbness/tingling/weakness, myoclonic jerks. She denies any significant headaches, vision changes, neck/back pain. No side effects on Keppra. She occasionally feels dizzy when coming up from a bending position, no falls or loss of consciousness. She does not drive.  HPI: This is a pleasant 46 yo RH woman with new onset seizure last 12/25/14. She has no recollection of events, she recalls going to work that morning, then waking up in the hospital. She and her husband work together, and he reports that on the morning of 12/25/14, prior to going to work, he noticed her standing at the door, staring and unresponsive for around 5 seconds. They went to work where she had a panic attack and another staring episode that she is amnestic of. They got home then later on he found her sitting on the couch unresponsive, then just saying "okay" when asked questions. She then stopped talking and started having lip smacking. He called 911, and when they arrived, she continued to be staring then proceeded to have a generalized tonic-clonic seizure was head turn to the right lasting  1-2 minutes. She had 3 more seizures en route and was given Versed. She was back to baseline in the ER and was started on Keppra. Bloodwork showed normal CBC except mild thrombocytopenia with platelets of 124, CMP unremarkable, UDS positive for benzodiazepines (received Versed in ambulance). 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 Routine wake and sleep EEG was normal.   Her husband reports that she has always had panic attacks since her 42s. He has also been noticing staring and unresponsive episodes for the past couple of months. She had tried Paxil for anxiety in her 44s but felt unwell and woke up in the hospital. There have been no further seizures, staring episodes, as well as no further panic attacks/anxiety since starting Keppra  BID. 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   Epilepsy Risk Factors: She had a normal birth and early development. There is no history of febrile convulsions, CNS infections such as meningitis/encephalitis, significant traumatic brain injury, neurosurgical procedures, or family history of seizures.  PAST MEDICAL HISTORY: Past Medical History:  Diagnosis Date  . Anxiety     MEDICATIONS: Current Outpatient Prescriptions on File Prior to Visit  Medication Sig Dispense Refill  . levETIRAcetam (KEPPRA) 500 MG tablet TAKE 1 TABLET BY MOUTH 2 TIMES DAILY. 60 tablet 11   No current facility-administered medications on file prior to visit.     ALLERGIES: Allergies  Allergen Reactions  . Amoxicillin     hives  .  Penicillins     hives    FAMILY HISTORY: No family history on file.  SOCIAL HISTORY: Social History   Social History  . Marital status: Married    Spouse name: N/A  . Number of children: 1  . Years of education: N/A   Occupational History  .  Housekeeper    Social History Main Topics  . Smoking status: Current Every Day Smoker    Types: Cigarettes  . Smokeless tobacco: Never Used  . Alcohol use 0.0 oz/week  . Drug use: No  . Sexual activity: Not on file   Other Topics Concern  . Not on file   Social History Narrative  . No narrative on file    REVIEW OF SYSTEMS: Constitutional: No fevers, chills, or sweats, no generalized fatigue, change in appetite Eyes: No visual changes, double vision, eye pain Ear, nose and throat: No hearing loss, ear pain, nasal congestion, sore throat Cardiovascular: No chest pain, palpitations Respiratory:  No shortness of breath at rest or with exertion, wheezes GastrointestinaI: No nausea, vomiting, diarrhea, abdominal pain, fecal incontinence Genitourinary:  No dysuria, urinary retention or frequency Musculoskeletal:  No neck pain, back pain Integumentary: No rash, pruritus, skin lesions Neurological: as above Psychiatric: No depression, insomnia, anxiety Endocrine: No palpitations, fatigue, diaphoresis, mood swings, change in appetite, change in weight, increased thirst Hematologic/Lymphatic:  No anemia, purpura, petechiae. Allergic/Immunologic: no itchy/runny eyes, nasal congestion, recent allergic reactions, rashes  PHYSICAL EXAM: Vitals:   01/27/17 0830  BP: 108/72  Pulse: 90  SpO2: 98%   General: No acute distress Head:  Normocephalic/atraumatic Neck: supple, no paraspinal tenderness, full range of motion Heart:  Regular rate and rhythm Lungs:  Clear to auscultation bilaterally Back: No paraspinal tenderness Skin/Extremities: No rash, no edema Neurological Exam: alert and oriented to person, place, and time. No aphasia or dysarthria. Fund of knowledge is appropriate.  Recent and remote memory are intact.  Attention and concentration are normal.    Able to name objects and repeat phrases. Cranial nerves: Pupils equal, round, reactive to light. Extraocular movements intact with  no nystagmus. Visual fields full. Facial sensation intact. No facial asymmetry. Tongue, uvula, palate midline.  Motor: Bulk and tone normal, muscle strength 5/5 throughout with no pronator drift.  Sensation to light touch intact.  No extinction to double simultaneous stimulation.  Deep tendon reflexes 2+ throughout, toes downgoing.  Finger to nose testing intact.  Gait narrow-based and steady, able to tandem walk adequately.  Romberg negative.  IMPRESSION: This is a pleasant 46 yo RH woman with a history of panic attacks/anxiety, as well as episodes of staring/unresponsiveness, who had several GTCs last 12/25/14 preceded by staring/unresponsiveness and lip smacking, suggestive of temporal lobe epilepsy. 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 EEG normal. She has been doing well with no further seizures since starting Keppra  BID. No side effects on Keppra. We discussed that she has been 2 years seizure-free and risks and benefits of continuing medication, we agreed to continue Keppra  BID. Refills sent today. She does not drive and is aware of [place] driving laws to stop driving after a seizure, until 6 months seizure-free. She will follow-up in 1 year and knows to call for any problems.   Thank you for allowing me to participate in her care.  Please do not hesitate to call for any questions or concerns.  The duration of this appointment visit was 15 minutes of face-to-face time with the patient.  Greater than 50% of this  time was spent in counseling, explanation of diagnosis, planning of further management, and coordination of care.   Patrcia Dolly, M.D.   CC: Dr. Armen Pickup

## 2017-02-11 MED FILL — ?LEVETIRACETAM 500 MG TABLE: 500 | 30 days supply | Qty: 60 | Fill #0

## 2017-03-16 MED FILL — levETIRAcetam 500 MG TABS: 500 | 30 days supply | Qty: 60 | Fill #1

## 2017-04-15 MED FILL — ?LEVETIRACETAM 500 MG TABLE: 500 | 30 days supply | Qty: 60 | Fill #2

## 2017-04-24 ENCOUNTER — Emergency Department (HOSPITAL_COMMUNITY)
Admission: EM | Admit: 2017-04-24 | Discharge: 2017-04-24 | Disposition: A | Discharge status: Home / Self Care | Payer: Self-pay | Attending: Emergency Medicine | Admitting: Emergency Medicine

## 2017-04-24 ENCOUNTER — Emergency Department (HOSPITAL_COMMUNITY): Payer: Self-pay

## 2017-04-24 ENCOUNTER — Encounter (HOSPITAL_COMMUNITY): Payer: Self-pay | Admitting: *Deleted

## 2017-04-24 DIAGNOSIS — Z9114 Patient's other noncompliance with medication regimen: Secondary | ICD-10-CM | POA: Insufficient documentation

## 2017-04-24 DIAGNOSIS — Z79899 Other long term (current) drug therapy: Secondary | ICD-10-CM | POA: Insufficient documentation

## 2017-04-24 DIAGNOSIS — R569 Unspecified convulsions: Secondary | ICD-10-CM | POA: Insufficient documentation

## 2017-04-24 DIAGNOSIS — F17210 Nicotine dependence, cigarettes, uncomplicated: Secondary | ICD-10-CM | POA: Insufficient documentation

## 2017-04-24 HISTORY — DX: Unspecified convulsions: R56.9

## 2017-04-24 LAB — URINALYSIS, ROUTINE W REFLEX MICROSCOPIC
Bilirubin Urine: NEGATIVE
Glucose, UA: NEGATIVE mg/dL
Hgb urine dipstick: NEGATIVE
Ketones, ur: 5 mg/dL — AB
Nitrite: NEGATIVE
PH: 5 (ref 5.0–8.0)
Protein, ur: NEGATIVE mg/dL
SPECIFIC GRAVITY, URINE: 1.011 (ref 1.005–1.030)

## 2017-04-24 LAB — COMPREHENSIVE METABOLIC PANEL
ALBUMIN: 3.6 g/dL (ref 3.5–5.0)
ALK PHOS: 50 U/L (ref 38–126)
ALT: 11 U/L — ABNORMAL LOW (ref 14–54)
ANION GAP: 10 (ref 5–15)
AST: 20 U/L (ref 15–41)
BUN: 11 mg/dL (ref 6–20)
CALCIUM: 8.3 mg/dL — AB (ref 8.9–10.3)
CO2: 22 mmol/L (ref 22–32)
Chloride: 105 mmol/L (ref 101–111)
Creatinine, Ser: 0.64 mg/dL (ref 0.44–1.00)
GFR calc Af Amer: >60 mL/min (ref >60)
Glucose, Bld: 108 mg/dL — ABNORMAL HIGH (ref 65–99)
Potassium: 3.9 mmol/L (ref 3.5–5.1)
Sodium: 137 mmol/L (ref 135–145)
TOTAL PROTEIN: 5.8 g/dL — AB (ref 6.5–8.1)
Total Bilirubin: 0.5 mg/dL (ref 0.3–1.2)

## 2017-04-24 LAB — CBC
HCT: 36.3 % (ref 36.0–46.0)
HEMOGLOBIN: 12.1 g/dL (ref 12.0–15.0)
MCH: 31.3 pg (ref 26.0–34.0)
MCHC: 33.3 g/dL (ref 30.0–36.0)
MCV: 93.8 fL (ref 78.0–100.0)
Platelets: 144 10*3/uL — ABNORMAL LOW (ref 150–400)
RBC: 3.87 MIL/uL (ref 3.87–5.11)
RDW: 13.1 % (ref 11.5–15.5)
WBC: 7.5 10*3/uL (ref 4.0–10.5)

## 2017-04-24 LAB — RAPID URINE DRUG SCREEN, HOSP PERFORMED
Amphetamines: NOT DETECTED
BENZODIAZEPINES: POSITIVE — AB
Barbiturates: NOT DETECTED
COCAINE: NOT DETECTED
OPIATES: NOT DETECTED
Tetrahydrocannabinol: NOT DETECTED

## 2017-04-24 LAB — ETHANOL

## 2017-04-24 LAB — I-STAT BETA HCG BLOOD, ED (MC, WL, AP ONLY)

## 2017-04-24 MED ORDER — SODIUM CHLORIDE 0.9 % IV SOLN
1000.0000 mg | Freq: Once | INTRAVENOUS | Status: AC
  Administered 2017-04-24: 1000 mg via INTRAVENOUS
  Filled 2017-04-24: qty 10

## 2017-04-24 NOTE — Discharge Instructions (Signed)
Please take your keppra as scheduled. Recheck with your doctor next week. No driving or other activities which would cause you or others harm if you were to have a seizure until cleared by your neurologist.

## 2017-04-24 NOTE — ED Notes (Signed)
Ambulated pt from room to nurse station and back.  Pt had steady gate.

## 2017-04-24 NOTE — ED Triage Notes (Signed)
To ED via [facility] for eval of seizure. Last being 2 years ago. One this am at home and one tonic/clonic in ems presence. Pt postictal on arrival. Resp e/u. Pt is on Keppra - unknown if pt missed a Keppra dose. No family with pt at this time. Pt attempting to sit up and move about the bed. Pt was incontinent of urine. Cleaned. Pupils dilated but reactive. Pt opens eyes to speech

## 2017-04-24 NOTE — ED Provider Notes (Signed)
[facility] Provider Note   CSN: [id number] Arrival date & time: 04/24/17  0915     History   Chief Complaint No chief complaint on file.   HPI Tracy Kelly is a 46 y.o. female. Level 5 caveat Patient confused and appears postictal All history obtained from EMS, they report her husband was at house and is supposed to be coming to ED HPI 46 year old female reported history of one seizure 2 years ago with grand mal seizure x2 this a.m. husband reported to EMS the patient had one seizure.  Patient was post ictal appearing on EMS arrival and had a witnessed grand mal seizure that lasted approximately 45 seconds.  She was given Versed by EMS.  She has continued to be confused with decreased responsiveness from the time they arrived, through the second seizure, and in route to ED.  She is currently unable to give any history Past Medical History:  Diagnosis Date  . Anxiety     Patient Active Problem List   Diagnosis Date Noted  . Cerumen impaction 03/21/2015  . Tinnitus 03/21/2015  . Localization-related (focal) (partial) symptomatic epilepsy and epileptic syndromes with complex partial seizures, not intractable, without status epilepticus (HCC) 03/20/2015  . Hypokalemia 12/26/2014    No past surgical history on file.  OB History    No data available       Home Medications    Prior to Admission medications   Medication Sig Start Date End Date Taking? Authorizing Provider  levETIRAcetam (KEPPRA) 500 MG tablet Take 1 tablet (500 mg total) by mouth 2 (two) times daily. 01/27/17   Van ClinesAquino, Karen M, MD    Family History No family history on file.  Social History Social History   Tobacco Use  . Smoking status: Current Every Day Smoker    Types: Cigarettes  . Smokeless tobacco: Never Used  Substance Use Topics  . Alcohol use: Yes    Alcohol/week: 0.0 oz  . Drug use: No     Allergies   Amoxicillin and  Penicillins   Review of Systems Review of Systems  Unable to perform ROS: Mental status change     Physical Exam Updated Vital Signs There were no vitals taken for this visit.  Physical Exam  Constitutional: She appears well-developed and well-nourished.  HENT:  Head: Atraumatic.  Right Ear: External ear normal.  Left Ear: External ear normal.  Mouth/Throat: Oropharynx is clear and moist.  Eyes: Pupils are equal, round, and reactive to light.  Neck: Normal range of motion.  Cardiovascular: Normal rate and regular rhythm.  Pulmonary/Chest: Effort normal and breath sounds normal.  Abdominal: Soft. Bowel sounds are normal.  Musculoskeletal: Normal range of motion.  Neurological:  Moving all 4 extremities nonpurposeful  Skin: Skin is warm. Capillary refill takes less than 2 seconds.     ED Treatments / Results  Labs (all labs ordered are listed, but only abnormal results are displayed) Labs Reviewed  CBC  COMPREHENSIVE METABOLIC PANEL  URINALYSIS, ROUTINE W REFLEX MICROSCOPIC  ETHANOL  RAPID URINE DRUG SCREEN, HOSP PERFORMED  I-STAT BETA HCG BLOOD, ED ([facility], [facility], [facility] ONLY)    EKG  EKG Interpretation None       Radiology No results found.  Procedures Procedures (including critical care time)  Medications Ordered in ED Medications  levETIRAcetam (KEPPRA) 1,000 mg in sodium chloride 0.9 % 100 mL IVPB (not administered)     Initial Impression / Assessment and Plan / ED Course  I  have reviewed the triage vital signs and the nursing notes.  Pertinent labs & imaging results that were available during my care of the patient were reviewed by me and considered in my medical decision making (see chart for details).    10:58 AM Patient improved - aawake and answeing quistiong.  Did not take keppera last night. 1:32 PM Repeat neuro exam without focal deficit.  Patient ambulating normally.  46 year old female who did not take her Keppra last night, drink alcohol, and  subsequently had 2 seizures this morning.  She was initially postictal but now is awake and alert.  Plan to ambulate patient and she will be discharged home.  We have discussed the necessity of taking her Keppra.  She voices understanding.  Final Clinical Impressions(s) / ED Diagnoses   Final diagnoses:  Seizure [facility](HCC)  Noncompliance with medications    ED Discharge Orders    None       Margarita Grizzleay, Edin Skarda, MD 04/24/17 [phone]

## 2017-04-24 NOTE — ED Notes (Signed)
Patient returned from CT patient is crying inappropriately  Family at bedside.

## 2017-05-04 ENCOUNTER — Ambulatory Visit: Payer: Self-pay | Attending: Family Medicine

## 2017-05-15 MED FILL — ?LEVETIRACETAM 500 MG TABLE: 500 | 30 days supply | Qty: 60 | Fill #3

## 2017-05-19 ENCOUNTER — Encounter: Payer: Self-pay | Admitting: Family Medicine

## 2017-05-19 ENCOUNTER — Ambulatory Visit: Payer: Self-pay | Attending: Family Medicine | Admitting: Family Medicine

## 2017-05-19 VITALS — BP 97/65 | HR 81 | Temp 97.6°F | Ht 66.0 in | Wt 150.2 lb

## 2017-05-19 DIAGNOSIS — Z79899 Other long term (current) drug therapy: Secondary | ICD-10-CM | POA: Insufficient documentation

## 2017-05-19 DIAGNOSIS — Z13228 Encounter for screening for other metabolic disorders: Secondary | ICD-10-CM

## 2017-05-19 DIAGNOSIS — Z0000 Encounter for general adult medical examination without abnormal findings: Secondary | ICD-10-CM

## 2017-05-19 DIAGNOSIS — G40209 Localization-related (focal) (partial) symptomatic epilepsy and epileptic syndromes with complex partial seizures, not intractable, without status epilepticus: Secondary | ICD-10-CM | POA: Insufficient documentation

## 2017-05-19 NOTE — Progress Notes (Signed)
Subjective:  Patient ID: Tracy Kelly, female    DOB: [birth year]  Age: 47 y.o. MRN: [id number]  CC: Seizures  HPI Tracy Kelly is a 47 year old female with a history of seizures who presents today to establish care with me. She last had a seizure on 04/24/17 for which she was seen at the emergency room at [facility] and had admitted to missing her evening dose of Keppra.  Labs were unremarkable;she received IV fluids and a loading dose of Keppra after which she was discharged and there was no adjustment to her Keppra dose.  She is closely followed by Dr.Aquino of fluid by neurology with her last visit in 01/2017 and her upcoming visit in 01/2018. Today she informs me that she sometimes skips her evening dose of Keppra in an attempt to prevent overdosing due to being unsure about taking the dose and we have discussed reminders and mechanisms to assist with compliance.  Past Medical History:  Diagnosis Date  . Anxiety   . Seizures (HCC)     No past surgical history on file.  Allergies  Allergen Reactions  . Amoxicillin     hives  . Penicillins     hives     Outpatient Medications Prior to Visit  Medication Sig Dispense Refill  . acetaminophen (TYLENOL) 500 MG tablet Take 500 mg by mouth every 6 (six) hours as needed for headache.    . levETIRAcetam (KEPPRA) 500 MG tablet Take 1 tablet (500 mg total) by mouth 2 (two) times daily. 60 tablet 11  . BIOTIN 5000 PO Take 1 tablet by mouth daily.     No facility-administered medications prior to visit.     ROS Review of Systems  Constitutional: Negative for activity change, appetite change and fatigue.  HENT: Negative for congestion, sinus pressure and sore throat.   Eyes: Negative for visual disturbance.  Respiratory: Negative for cough, chest tightness, shortness of breath and wheezing.   Cardiovascular: Negative for chest pain and palpitations.  Gastrointestinal: Negative for abdominal distention, abdominal  pain and constipation.  Endocrine: Negative for polydipsia.  Genitourinary: Negative for dysuria and frequency.  Musculoskeletal: Negative for arthralgias and back pain.  Skin: Negative for rash.  Neurological: Negative for tremors, light-headedness and numbness.  Hematological: Does not bruise/bleed easily.  Psychiatric/Behavioral: Negative for agitation and behavioral problems.    Objective:  BP 97/65   Pulse 81   Temp 97.6 F (36.4 C) (Oral)   Ht 5\' 6"  (1.676 m)   Wt 150 lb 3.2 oz (68.1 kg)   LMP 05/08/2017   SpO2 100%   BMI 24.24 kg/m   BP/Weight 05/19/2017 04/24/2017 01/27/2017  Systolic BP 97 96 108  Diastolic BP 65 57 72  Wt. (Lbs) 150.2 - -  BMI 24.24 - -      Physical Exam  Constitutional: She is oriented to person, place, and time. She appears well-developed and well-nourished.  Cardiovascular: Normal rate, normal heart sounds and intact distal pulses.  No murmur heard. Pulmonary/Chest: Effort normal and breath sounds normal. She has no wheezes. She has no rales. She exhibits no tenderness.  Abdominal: Soft. Bowel sounds are normal. She exhibits no distension and no mass. There is no tenderness.  Musculoskeletal: Normal range of motion.  Neurological: She is alert and oriented to person, place, and time.  Skin: Skin is warm and dry.  Psychiatric: She has a normal mood and affect.     Assessment & Plan:   1. Localization-related (focal) (partial)  symptomatic epilepsy and epileptic syndromes with complex partial seizures, not intractable, without status epilepticus (HCC) Last seizure was on 04/24/17 Continue Keppra [place] - no driving until 6 months seizure-free; currently does not drive and does not on a driver's license. Appointment with neurology comes up in 01/2018  2. Screening for metabolic disorder - Lipid panel; Future - HIV antibody (with reflex); Future  3. Healthcare maintenance Declines mammogram, Pap smear  despite discussing consequences/implications of her decision   No orders of the defined types were placed in this encounter.   Follow-up: Return in about 6 months (around 11/16/2017) for follow up on seizures.   Jaclyn ShaggyEnobong Amao MD

## 2017-05-19 NOTE — Patient Instructions (Signed)
Epilepsy °Epilepsy is a condition in which a person has repeated seizures over time. A seizure is a sudden burst of abnormal electrical and chemical activity in the brain. Seizures can cause a change in attention, behavior, or the ability to remain awake and alert (altered mental status). °Epilepsy increases a person's risk of falls, accidents, and injury. It can also lead to complications, including: °· Depression. °· Poor memory. °· Sudden unexplained death in epilepsy (SUDEP). This complication is rare, and its cause is not known. ° °Most people with epilepsy lead normal lives. °What are the causes? °This condition may be caused by: °· A head injury. °· An injury that happens at birth. °· A high fever during childhood. °· A stroke. °· Bleeding that goes into or around the brain. °· Certain medicines and drugs. °· Having too little oxygen for a long period of time. °· Abnormal brain development. °· Certain infections, such as meningitis and encephalitis. °· Brain tumors. °· Conditions that are passed along from parent to child (are hereditary). ° °What are the signs or symptoms? °Symptoms of a seizure vary greatly from person to person. They include: °· Convulsions. °· Stiffening of the body. °· Involuntary movements of the arms or legs. °· Loss of consciousness. °· Breathing problems. °· Falling suddenly. °· Confusion. °· Head nodding. °· Eye blinking or fluttering. °· Lip smacking. °· Drooling. °· Rapid eye movements. °· Grunting. °· Loss of bladder control and bowel control. °· Staring. °· Unresponsiveness. ° °Some people have symptoms right before a seizure happens (aura) and right after a seizure happens. Symptoms of an aura include: °· Fear or anxiety. °· Nausea. °· Feeling like the room is spinning (vertigo). °· A feeling of having seen or heard something before (deja vu). °· Odd tastes or smells. °· Changes in vision, such as seeing flashing lights or spots. ° °Symptoms that follow a seizure  include: °· Confusion. °· Sleepiness. °· Headache. ° °How is this diagnosed? °This condition is diagnosed based on: °· Your symptoms. °· Your medical history. °· A physical exam. °· A neurological exam. A neurological exam is similar to a physical exam. It involves checking your strength, reflexes, coordination, and sensations. °· Tests, such as: °? An electroencephalogram (EEG). This is a painless test that creates a diagram of your brain waves. °? An MRI of the brain. °? A CT scan of the brain. °? A lumbar puncture, also called a spinal tap. °? Blood tests to check for signs of infection or abnormal blood chemistry. ° °How is this treated? °There is no cure for this condition, but treatment can help control seizures. Treatment may involve: °· Taking medicines to control seizures. These include medicines to prevent seizures and medicines to stop seizures as they occur. °· Having a device called a vagus nerve stimulator implanted in the chest. The device sends electrical impulses to the vagus nerve and to the brain to prevent seizures. This treatment may be recommended if medicines do not help. °· Brain surgery. There are several kinds of surgeries that may be done to stop seizures from happening or to reduce how often seizures happen. °· Having regular blood tests. You may need to have blood tests regularly to check that you are getting the right amount of medicine. ° °Once this condition has been diagnosed, it is important to begin treatment as soon as possible. For some people, epilepsy eventually goes away. °Follow these instructions at home: °Medicines ° °· Take over-the-counter and prescription medicines only as   told by your health care provider. °· Avoid any substances that may prevent your medicine from working properly, such as alcohol. °Activity °· Get enough rest. Lack of sleep can make seizures more likely to occur. °· Follow instructions from your health care provider about driving, swimming, and doing  any other activities that would be dangerous if you had a seizure. °Educating others °Teach friends and family what to do if you have a seizure. They should: °· Lay you on the ground to prevent a fall. °· Cushion your head and body. °· Loosen any tight clothing around your neck. °· Turn you on your side. If vomiting occurs, this helps keep your airway clear. °· Stay with you until you recover. °· Not hold you down. Holding you down will not stop the seizure. °· Not put anything in your mouth. °· Know whether or not you need emergency care. ° °General instructions °· Avoid anything that has ever triggered a seizure for you. °· Keep a seizure diary. Record what you remember about each seizure, especially anything that might have triggered the seizure. °· Keep all follow-up visits as told by your health care provider. This is important. °Contact a health care provider if: °· Your seizure pattern changes. °· You have symptoms of infection or another illness. This might increase your risk of having a seizure. °Get help right away if: °· You have a seizure that does not stop after 5 minutes. °· You have several seizures in a row without a complete recovery in between seizures. °· You have a seizure that makes it harder to breathe. °· You have a seizure that is different from previous seizures. °· You have a seizure that leaves you unable to speak or use a part of your body. °· You did not wake up immediately after a seizure. °This information is not intended to replace advice given to you by your health care provider. Make sure you discuss any questions you have with your health care provider. °Document Released: 04/14/2005 Document Revised: 11/10/2015 Document Reviewed: 10/23/2015 °Elsevier Interactive Patient Education © 2018 [employer] ° °

## 2017-05-29 ENCOUNTER — Other Ambulatory Visit: Payer: Self-pay

## 2017-06-01 ENCOUNTER — Ambulatory Visit: Payer: Self-pay | Attending: Family Medicine

## 2017-06-01 DIAGNOSIS — Z13228 Encounter for screening for other metabolic disorders: Secondary | ICD-10-CM | POA: Insufficient documentation

## 2017-06-01 NOTE — Progress Notes (Signed)
Patient here for lab visit  

## 2017-06-02 LAB — HIV ANTIBODY (ROUTINE TESTING W REFLEX): HIV Screen 4th Generation wRfx: NONREACTIVE

## 2017-06-02 LAB — LIPID PANEL
CHOL/HDL RATIO: 2.6 ratio (ref 0.0–4.4)
Cholesterol, Total: 166 mg/dL (ref 100–199)
HDL: 64 mg/dL (ref >39)
LDL Calculated: 93 mg/dL (ref 0–99)
TRIGLYCERIDES: 44 mg/dL (ref 0–149)
VLDL Cholesterol Cal: 9 mg/dL (ref 5–40)

## 2017-06-16 MED FILL — levETIRAcetam 500 MG TABS: 500 | 30 days supply | Qty: 60 | Fill #4

## 2017-07-15 MED FILL — levETIRAcetam 500 MG TABS: 500 | 30 days supply | Qty: 60 | Fill #5

## 2017-07-17 ENCOUNTER — Ambulatory Visit: Payer: Self-pay | Attending: Family Medicine | Admitting: Family Medicine

## 2017-07-17 ENCOUNTER — Encounter: Payer: Self-pay | Admitting: Family Medicine

## 2017-07-17 VITALS — BP 104/70 | HR 91 | Temp 97.8°F | Ht 66.0 in | Wt 152.4 lb

## 2017-07-17 DIAGNOSIS — H479 Unspecified disorder of visual pathways: Secondary | ICD-10-CM | POA: Insufficient documentation

## 2017-07-17 DIAGNOSIS — R569 Unspecified convulsions: Secondary | ICD-10-CM | POA: Insufficient documentation

## 2017-07-17 DIAGNOSIS — G40209 Localization-related (focal) (partial) symptomatic epilepsy and epileptic syndromes with complex partial seizures, not intractable, without status epilepticus: Secondary | ICD-10-CM

## 2017-07-17 DIAGNOSIS — H47099 Other disorders of optic nerve, not elsewhere classified, unspecified eye: Secondary | ICD-10-CM | POA: Insufficient documentation

## 2017-07-17 DIAGNOSIS — Z880 Allergy status to penicillin: Secondary | ICD-10-CM | POA: Insufficient documentation

## 2017-07-17 DIAGNOSIS — F419 Anxiety disorder, unspecified: Secondary | ICD-10-CM | POA: Insufficient documentation

## 2017-07-17 NOTE — Progress Notes (Signed)
   Subjective:  Patient ID: Annye EnglishMelody K Emley, female    DOB: 05-30-1970  Age: 47 y.o. MRN: [id number]  CC: Eye Problem   HPI Jaid Beverly MilchK Behunin is a 47 year old female with a history of seizures who presents today requesting an ophthalmology referral as she was informed at her [facility] with [facility] that she had a right eye nerve damage. She denies visual concerns but his reading glasses. She has been compliant with her Keppra which she takes for seizures and has not had a seizure in the last 3 months.  Past Medical History:  Diagnosis Date  . Anxiety   . Seizures (HCC)     No past surgical history on file.  Allergies  Allergen Reactions  . Amoxicillin     hives  . Penicillins     hives     Outpatient Medications Prior to Visit  Medication Sig Dispense Refill  . levETIRAcetam (KEPPRA) 500 MG tablet Take 1 tablet (500 mg total) by mouth 2 (two) times daily. 60 tablet 11  . acetaminophen (TYLENOL) 500 MG tablet Take 500 mg by mouth every 6 (six) hours as needed for headache.    Marland Kitchen. BIOTIN 5000 PO Take 1 tablet by mouth daily.     No facility-administered medications prior to visit.     ROS Review of Systems  Constitutional: Negative for activity change and appetite change.  HENT: Negative for sinus pressure and sore throat.   Respiratory: Negative for chest tightness, shortness of breath and wheezing.   Cardiovascular: Negative for chest pain and palpitations.  Gastrointestinal: Negative for abdominal distention, abdominal pain and constipation.  Genitourinary: Negative.   Musculoskeletal: Negative.   Psychiatric/Behavioral: Negative for behavioral problems and dysphoric mood.    Objective:  BP 104/70   Pulse 91   Temp 97.8 F (36.6 C) (Oral)   Ht 5\' 6"  (1.676 m)   Wt 152 lb 6.4 oz (69.1 kg)   LMP 06/26/2017   SpO2 97%   BMI 24.60 kg/m   BP/Weight 07/17/2017 05/19/2017 04/24/2017  Systolic BP 104 97 96  Diastolic BP 70 65 57  Wt. (Lbs) 152.4  150.2 -  BMI 24.6 24.24 -      Physical Exam  Constitutional: She is oriented to person, place, and time. She appears well-developed and well-nourished.  Cardiovascular: Normal rate, normal heart sounds and intact distal pulses.  No murmur heard. Pulmonary/Chest: Effort normal and breath sounds normal. She has no wheezes. She has no rales. She exhibits no tenderness.  Abdominal: Soft. Bowel sounds are normal. She exhibits no distension and no mass. There is no tenderness.  Musculoskeletal: Normal range of motion.  Neurological: She is alert and oriented to person, place, and time.  Skin: Skin is warm and dry.  Psychiatric: She has a normal mood and affect.     Assessment & Plan:   1. 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 Localization-related (focal) (partial) symptomatic epilepsy and epileptic syndromes with complex partial seizures, not intractable, without status epilepticus (HCC) Last seizure was 3 months ago She does not drive   No orders of the defined types were placed in this encounter.   Follow-up: Return for follow up of chronic medical conditions, keep previously scheduled appointment.   Hoy RegisterEnobong Maureen Duesing MD

## 2017-08-05 ENCOUNTER — Telehealth: Payer: Self-pay | Admitting: Family Medicine

## 2017-08-05 NOTE — Telephone Encounter (Signed)
Pt called requesting status of referral to opthalmology. Please f/up

## 2017-08-06 NOTE — Telephone Encounter (Signed)
[facility] ph # [phone] address [address] they will contact the patient to schedule an appointment.

## 2017-08-12 MED FILL — levETIRAcetam 500 MG TABS: 500 | 30 days supply | Qty: 60 | Fill #6

## 2017-08-20 ENCOUNTER — Ambulatory Visit: Payer: PRIVATE HEALTH INSURANCE

## 2017-08-20 ENCOUNTER — Ambulatory Visit: Payer: Self-pay | Attending: Family Medicine | Admitting: Physician Assistant

## 2017-08-20 VITALS — BP 111/80 | HR 90 | Temp 98.8°F | Resp 16 | Ht 66.0 in | Wt 149.8 lb

## 2017-08-20 DIAGNOSIS — H6122 Impacted cerumen, left ear: Secondary | ICD-10-CM | POA: Insufficient documentation

## 2017-08-20 DIAGNOSIS — Z880 Allergy status to penicillin: Secondary | ICD-10-CM | POA: Insufficient documentation

## 2017-08-20 DIAGNOSIS — F419 Anxiety disorder, unspecified: Secondary | ICD-10-CM | POA: Insufficient documentation

## 2017-08-20 DIAGNOSIS — H6690 Otitis media, unspecified, unspecified ear: Secondary | ICD-10-CM | POA: Insufficient documentation

## 2017-08-20 DIAGNOSIS — Z79899 Other long term (current) drug therapy: Secondary | ICD-10-CM | POA: Insufficient documentation

## 2017-08-20 MED ORDER — DOXYCYCLINE HYCLATE 100 MG PO TABS: 100.0000 mg | ORAL_TABLET | Freq: Two times a day (BID) | ORAL | 0 refills | Status: DC

## 2017-08-20 MED FILL — DOXYCYCLINE HYCLATE 100 MG: 100 | 10 days supply | Qty: 20 | Fill #0

## 2017-08-20 NOTE — Progress Notes (Signed)
Pt. Stated her left ear is giving her pain and radiates down to her neck.

## 2017-08-20 NOTE — Progress Notes (Signed)
Patient ID: Tracy Kelly, female   DOB: 1970/12/04, 47 y.o.   MRN: [id number]   Tracy Kelly, is a 47 y.o. female  [id number]  [id number]  DOB - June 16, 1970  Subjective:  Chief Complaint and HPI: Tracy Kelly is a 47 y.o. female here today with L ear pain and has been having some brown drainage coming out of her ear.  On and off problems with ears for years.  Cerumen impactions are recurrent.     ROS:   Constitutional:  No f/c, No night sweats, No unexplained weight loss. EENT:  No vision changes, No blurry vision, No hearing changes. No additional mouth, throat, or ear problems.  Respiratory: No cough, No SOB Cardiac: No CP, no palpitations GI:  No abd pain, No N/V/D. GU: No Urinary s/sx Musculoskeletal: No joint pain Neuro: No headache, no dizziness, no motor weakness.  Skin: No rash Endocrine:  No polydipsia. No polyuria.  Psych: Denies SI/HI  No problems updated.  ALLERGIES: Allergies  Allergen Reactions  . Amoxicillin     hives  . Penicillins     hives    PAST MEDICAL HISTORY: Past Medical History:  Diagnosis Date  . Anxiety   . Seizures (HCC)     MEDICATIONS AT HOME: Prior to Admission medications   Medication Sig Start Date End Date Taking? Authorizing Provider  acetaminophen (TYLENOL) 500 MG tablet Take 500 mg by mouth every 6 (six) hours as needed for headache.   Yes [provider]  BIOTIN 5000 PO Take 1 tablet by mouth daily.   Yes [provider]  levETIRAcetam (KEPPRA) 500 MG tablet Take 1 tablet (500 mg total) by mouth 2 (two) times daily. 01/27/17  Yes Van Clines, MD  doxycycline (VIBRA-TABS) 100 MG tablet Take 1 tablet (100 mg total) by mouth 2 (two) times daily. 08/20/17   Anders Simmonds, PA-C     Objective:  EXAM:   Vitals:   08/20/17 1533  BP: 111/80  Pulse: 90  Resp: 16  Temp: 98.8 F (37.1 C)  TempSrc: Oral  SpO2: 97%  Weight: 149 lb 12.8 oz (67.9 kg)  Height: 5\' 6"  (1.676 m)    General  appearance : A&OX3. NAD. Non-toxic-appearing HEENT: Atraumatic and Normocephalic.  PERRLA. R TM partially obstructed by cerumen, but TM WNL.  L canal completely obstructed by cerumen.  Used a cerumen scoop to pull out ~75% of the cerumen.  It pulled away from the skin in the canal and caused bleeding. TM itself is WNL.  There is still some remaining cerumen.   Neck: supple, no JVD. No cervical lymphadenopathy. No thyromegaly Chest/Lungs:  Breathing-non-labored, Good air entry bilaterally, breath sounds normal without rales, rhonchi, or wheezing  CVS: S1 S2 regular, no murmurs, gallops, rubs  Extremities: Bilateral Lower Ext shows no edema, both legs are warm to touch with = pulse throughout Neurology:  CN II-XII grossly intact, Non focal.   Psych:  TP linear. J/I WNL. Normal speech. Appropriate eye contact and affect.  Skin:  No Rash  Data Review No results found for: HGBA1C   Assessment & Plan   1. Acute otitis media, unspecified otitis media type TM is not infected-will cover for skin infection due to the large pull away of skin from the ear.  - doxycycline (VIBRA-TABS) 100 MG tablet; Take 1 tablet (100 mg total) by mouth 2 (two) times daily.  Dispense: 20 tablet; Refill: 0 - Ambulatory referral to ENT  2. Impacted cerumen of left ear -  doxycycline (VIBRA-TABS) 100 MG tablet; Take 1 tablet (100 mg total) by mouth 2 (two) times daily.  Dispense: 20 tablet; Refill: 0 - Ambulatory referral to ENT     Patient have been counseled extensively about nutrition and exercise  No follow-ups on file.  The patient was given clear instructions to go to ER or return to medical center if symptoms don't improve, worsen or new problems develop. The patient verbalized understanding. The patient was told to call to get lab results if they haven't heard anything in the next week.     Tracy CoAngela Leanette Eutsler, PA-C [facility] [place], [place] [phone]   08/20/2017,  3:53 PM

## 2017-08-31 DIAGNOSIS — H9319 Tinnitus, unspecified ear: Secondary | ICD-10-CM

## 2017-09-02 ENCOUNTER — Encounter: Payer: Self-pay | Admitting: Family Medicine

## 2017-09-02 LAB — HM DIABETES EYE EXAM

## 2017-09-07 ENCOUNTER — Telehealth: Payer: Self-pay | Admitting: Neurology

## 2017-09-07 NOTE — Telephone Encounter (Signed)
Spoke with pt.  Advised her to take her Keppra if she has not already.

## 2017-09-07 NOTE — Telephone Encounter (Signed)
Patient called and cannot remember if she took her Keppra medication this morning or not. She wanted to know could she go ahead and take one now? Please Call. Thanks

## 2017-09-14 MED FILL — levETIRAcetam 500 MG TABS: 500 | 30 days supply | Qty: 60 | Fill #7

## 2017-10-14 MED FILL — levETIRAcetam 500 MG TABS: 500 | 30 days supply | Qty: 60 | Fill #8

## 2017-11-16 ENCOUNTER — Telehealth: Payer: Self-pay

## 2017-11-16 ENCOUNTER — Telehealth: Payer: Self-pay | Admitting: Neurology

## 2017-11-16 NOTE — Telephone Encounter (Signed)
Called patient and left message for her to call back if she still needs to talk to someone.

## 2017-11-16 NOTE — Telephone Encounter (Signed)
Patient called unsure if she took her keppra this morning and wanted to know what she should do.  Ok to take again per Dr Karel JarvisAquino, may cause some drowsiness.  Patient informed.

## 2017-11-16 NOTE — Telephone Encounter (Signed)
[place] [phone]  Kenli called to say she had forgot if she had taken her medicine. Please advise

## 2017-11-18 MED FILL — levETIRAcetam 500 MG TABS: 500 | 30 days supply | Qty: 60 | Fill #9

## 2017-12-11 MED FILL — levETIRAcetam 500 MG TABS: 500 | 30 days supply | Qty: 60 | Fill #10

## 2018-01-12 MED FILL — levETIRAcetam 500 MG TABS: 500 | 30 days supply | Qty: 60 | Fill #11

## 2018-01-26 ENCOUNTER — Encounter: Payer: Self-pay | Admitting: Neurology

## 2018-01-26 ENCOUNTER — Ambulatory Visit (INDEPENDENT_AMBULATORY_CARE_PROVIDER_SITE_OTHER): Payer: BLUE CROSS/BLUE SHIELD | Admitting: Neurology

## 2018-01-26 ENCOUNTER — Other Ambulatory Visit: Payer: Self-pay

## 2018-01-26 DIAGNOSIS — G40209 Localization-related (focal) (partial) symptomatic epilepsy and epileptic syndromes with complex partial seizures, not intractable, without status epilepticus: Secondary | ICD-10-CM

## 2018-01-26 MED ORDER — LEVETIRACETAM 500 MG PO TABS: ORAL_TABLET | ORAL | 11 refills | Status: DC

## 2018-01-26 NOTE — Progress Notes (Signed)
NEUROLOGY FOLLOW UP OFFICE NOTE  Tracy Kelly [id number]  HISTORY OF PRESENT ILLNESS: I had the pleasure of seeing Tracy Kelly in follow-up in the neurology clinic on 01/26/2018. She is again accompanied by her husband who helps supplement the history today. The patient was last seen a year ago for new onset seizures in August 2016. 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 Routine EEG normal. She had been doing well, seizure-free for 2 years, until 04/24/17 when her husband witnessed her have the vacant stare, unresponsive, followed by a GTC, possible with head turn to the right. The seizure lasted 45 seconds, she was confused on EMS arrival and had a second seizure en route to [facility]. She reported missing Keppra the night prior and drinking alcohol. She was given IV Keppra. Head CT unremarkable, UDS positive for benzodiazepines (given Versed en route). She recalls having a lot of panic attacks the next day. She has had a couple more panic attacks in the past few months, no associated staring/unresponsiveness, confusion, olfactory/gustatory hallucinations, focal numbness/tingling/weakness, myoclonic jerks. She continues on Keppra 500mg  BID without side effects. She had a headache this morning, but this is not frequent. She felt dizzy yesterday at work. She denies any diplopia, no falls. She does not drive.  HPI: This is a pleasant 47 yo RH woman with new onset seizure last 12/25/14. She has no recollection of events, she recalls going to work that morning, then waking up in the hospital. She and her husband work together, and he reports that on the morning of 12/25/14, prior to going to work, he noticed her standing at the door, staring and unresponsive for around 5 seconds. They went to work where she had a panic attack and another staring episode that she is amnestic of. They got home then later on he found her sitting on the couch unresponsive, then just saying "okay" when asked  questions. She then stopped talking and started having lip smacking. He called 911, and when they arrived, she continued to be staring then proceeded to have a generalized tonic-clonic seizure was head turn to the right lasting 1-2 minutes. She had 3 more seizures en route and was given Versed. She was back to baseline in the ER and was started on Keppra. Bloodwork showed normal CBC except mild thrombocytopenia with platelets of 124, CMP unremarkable, UDS positive for benzodiazepines (received Versed in ambulance). 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 Routine wake and sleep EEG was normal.   Her husband reports that she has always had panic attacks since her 86s. He has also been noticing staring and unresponsive episodes for the past couple of months. She had tried Paxil for anxiety in her 51s, but felt unwell and woke up in the hospital. There have been no further seizures, staring episodes, as well as no further panic attacks/anxiety since starting Keppra 500mg  BID. 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   Epilepsy Risk Factors: She had a normal birth and early development. There is no history of febrile convulsions, CNS infections such as meningitis/encephalitis, significant traumatic brain injury, neurosurgical procedures, or family history of seizures.  PAST MEDICAL HISTORY: Past Medical History:  Diagnosis Date  . Anxiety   . Seizures (HCC)     MEDICATIONS: Current Outpatient Medications on File Prior to Visit  Medication Sig Dispense  Refill  . levETIRAcetam (KEPPRA) 500 MG tablet Take 1 tablet (500 mg total) by mouth 2 (two) times daily. 60 tablet 11  . acetaminophen (TYLENOL) 500 MG tablet Take 500 mg by mouth every 6 (six) hours as needed for headache.    Marland Kitchen BIOTIN 5000 PO Take 1 tablet by  mouth daily.     No current facility-administered medications on file prior to visit.     ALLERGIES: Allergies  Allergen Reactions  . Amoxicillin     hives  . Penicillins     hives    FAMILY HISTORY: No family history on file.  SOCIAL HISTORY: Social History   Socioeconomic History  . Marital status: Married    Spouse name: Not on file  . Number of children: 1  . Years of education: Not on file  . Highest education level: Not on file  Occupational History  . Occupation: Advertising copywriter  Social Needs  . Financial resource strain: Not on file  . Food insecurity:    Worry: Not on file    Inability: Not on file  . Transportation needs:    Medical: Not on file    Non-medical: Not on file  Tobacco Use  . Smoking status: Current Every Day Smoker    Types: Cigarettes  . Smokeless tobacco: Never Used  Substance and Sexual Activity  . Alcohol use: Yes    Alcohol/week: 0.0 standard drinks  . Drug use: No  . Sexual activity: Not on file  Lifestyle  . Physical activity:    Days per week: Not on file    Minutes per session: Not on file  . Stress: Not on file  Relationships  . Social connections:    Talks on phone: Not on file    Gets together: Not on file    Attends religious service: Not on file    Active member of club or organization: Not on file    Attends meetings of clubs or organizations: Not on file    Relationship status: Not on file  . Intimate partner violence:    Fear of current or ex partner: Not on file    Emotionally abused: Not on file    Physically abused: Not on file    Forced sexual activity: Not on file  Other Topics Concern  . Not on file  Social History Narrative  . Not on file    REVIEW OF SYSTEMS: Constitutional: No fevers, chills, or sweats, no generalized fatigue, change in appetite Eyes: No visual changes, double vision, eye pain Ear, nose and throat: No hearing loss, ear pain, nasal congestion, sore throat Cardiovascular: No chest  pain, palpitations Respiratory:  No shortness of breath at rest or with exertion, wheezes GastrointestinaI: No nausea, vomiting, diarrhea, abdominal pain, fecal incontinence Genitourinary:  No dysuria, urinary retention or frequency Musculoskeletal:  No neck pain, back pain Integumentary: No rash, pruritus, skin lesions Neurological: as above Psychiatric: No depression, insomnia, anxiety Endocrine: No palpitations, fatigue, diaphoresis, mood swings, change in appetite, change in weight, increased thirst Hematologic/Lymphatic:  No anemia, purpura, petechiae. Allergic/Immunologic: no itchy/runny eyes, nasal congestion, recent allergic reactions, rashes  PHYSICAL EXAM: Vitals:   01/26/18 0834  BP: 120/72  Pulse: 81  SpO2: 98%   General: No acute distress Head:  Normocephalic/atraumatic Neck: supple, no paraspinal tenderness, full range of motion Heart:  Regular rate and rhythm Lungs:  Clear to auscultation bilaterally Back: No paraspinal tenderness Skin/Extremities: No rash, no edema Neurological Exam: alert and oriented to person, place, and time.  No aphasia or dysarthria. Fund of knowledge is appropriate.  Recent and remote memory are intact.  Attention and concentration are normal.    Able to name objects and repeat phrases. Cranial nerves: Pupils equal, round, reactive to light. Extraocular movements intact with no nystagmus. Visual fields full. Facial sensation intact. No facial asymmetry. Tongue, uvula, palate midline.  Motor: Bulk and tone normal, muscle strength 5/5 throughout with no pronator drift.  Sensation to light touch intact.  No extinction to double simultaneous stimulation.  Finger to nose testing intact.  Gait narrow-based and steady, able to tandem walk adequately.  Romberg negative.  IMPRESSION: This is a pleasant 47 yo RH woman with a history of panic attacks/anxiety, as well as episodes of staring/unresponsiveness, who had several GTCs last 12/25/14 preceded by  staring/unresponsiveness and lip smacking, suggestive of temporal lobe epilepsy. 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 EEG normal. She had a seizure in December 2018 after missing a dose of Keppra. She reports occasional panic attacks, which she was having much more frequently prior to the first GTC. Increase Keppra to 750mg  BID, she was instructed to keep a calendar of the panic attacks if there is improvement on higher dose Keppra. She does not drive. We again discussed avoidance of seizure triggers, including missing medications, alcohol, sleep deprivation. She will follow-up in 8 months and knows to call for any problems.   Thank you for allowing me to participate in her care.  Please do not hesitate to call for any questions or concerns.  The duration of this appointment visit was 27 minutes of face-to-face time with the patient.  Greater than 50% of this time was spent in counseling, explanation of diagnosis, planning of further management, and coordination of care.   Patrcia Dolly, M.D.   CC: [facility]

## 2018-01-26 NOTE — Patient Instructions (Signed)
1. Increase Keppra 500mg : take 1.5 tablets twice a day 2. Keep a calendar of your symptoms 3. Follow-up in 8 months, call for any changes  Seizure Precautions: 1. If medication has been prescribed for you to prevent seizures, take it exactly as directed.  Do not stop taking the medicine without talking to your doctor first, even if you have not had a seizure in a long time.   2. Avoid activities in which a seizure would cause danger to yourself or to others.  Don't operate dangerous machinery, swim alone, or climb in high or dangerous places, such as on ladders, roofs, or girders.  Do not drive unless your doctor says you may.  3. If you have any warning that you may have a seizure, lay down in a safe place where you can't hurt yourself.    4.  No driving for 6 months from last seizure, as per [facility].   Please refer to the following link on the [employer]ile[employer] of [employer]'s website for more information: http://www.epilepsyfoundation.org/answerplace/Social/driving/drivingu.cfm   5.  Maintain good sleep hygiene. Avoid alcohol.  6.  Notify your neurology if you are planning pregnancy or if you become pregnant.  7.  Contact your doctor if you have any problems that may be related to the medicine you are taking.  8.  Call 911 and bring the patient back to the ED if:        A.  The seizure lasts longer than 5 minutes.       B.  The patient doesn't awaken shortly after the seizure  C.  The patient has new problems such as difficulty seeing, speaking or moving  D.  The patient was injured during the seizure  E.  The patient has a temperature over 102 F (39C)  F.  The patient vomited and now is having trouble breathing

## 2018-02-10 ENCOUNTER — Telehealth: Payer: Self-pay | Admitting: Neurology

## 2018-02-10 MED FILL — levETIRAcetam 500 MG TABS: 500 | 30 days supply | Qty: 90 | Fill #0

## 2018-02-10 NOTE — Telephone Encounter (Signed)
Returned call to pt.  No answer.  LMOM asking for return call for clarification as the only medication Dr. Karel Jarvis prescribes is Keppra - 30 day supply with 11 refills was sent to pharmacy on 01/26/18

## 2018-02-10 NOTE — Telephone Encounter (Signed)
Patient states that we need to call in keppra to [facility]   She will be out tomorrow

## 2018-02-10 NOTE — Telephone Encounter (Signed)
Patient called and left a vm that her 60 day supply was expired. Please call her back at [phone]. Thanks! She didn't leave anymore info.

## 2018-02-10 NOTE — Telephone Encounter (Signed)
Spoke with pharmacy - pt has Rx ready waiting for pick up with 11 available refills.

## 2018-02-10 NOTE — Telephone Encounter (Signed)
Pt made aware

## 2018-03-10 MED FILL — levETIRAcetam 500 MG TABS: 500 | 30 days supply | Qty: 90 | Fill #1

## 2018-04-09 MED FILL — levETIRAcetam 500 MG TABS: 500 | 30 days supply | Qty: 90 | Fill #2

## 2018-05-11 MED FILL — levETIRAcetam 500 MG TABS: 500 | 30 days supply | Qty: 90 | Fill #3

## 2018-06-09 MED FILL — levETIRAcetam 500 MG TABS: 500 | 30 days supply | Qty: 90 | Fill #4

## 2018-07-09 MED FILL — levETIRAcetam 500 MG TABS: 500 | 30 days supply | Qty: 90 | Fill #5

## 2018-07-26 ENCOUNTER — Telehealth (INDEPENDENT_AMBULATORY_CARE_PROVIDER_SITE_OTHER): Payer: BLUE CROSS/BLUE SHIELD | Admitting: Neurology

## 2018-07-26 ENCOUNTER — Other Ambulatory Visit: Payer: Self-pay

## 2018-07-26 DIAGNOSIS — G40209 Localization-related (focal) (partial) symptomatic epilepsy and epileptic syndromes with complex partial seizures, not intractable, without status epilepticus: Secondary | ICD-10-CM | POA: Diagnosis not present

## 2018-07-26 MED ORDER — LEVETIRACETAM 500 MG PO TABS: ORAL_TABLET | ORAL | 3 refills | Status: DC

## 2018-07-26 NOTE — Progress Notes (Signed)
Virtual Visit via Video Note The purpose of this virtual visit is to provide medical care while limiting exposure to the novel coronavirus.    Consent was obtained for video visit:  Yes.   Answered questions that patient had about telehealth interaction:  Yes.   I discussed the limitations, risks, security and privacy concerns of performing an evaluation and management service by telemedicine. I also discussed with the patient that there may be a patient responsible charge related to this service. The patient expressed understanding and agreed to proceed.  Pt location: Home Physician Location: office Name of referring provider:  [facility] * I connected with Tracy Kelly at patients initiation/request on 07/26/2018 at 10:00 AM EDT by video enabled telemedicine application and verified that I am speaking with the correct person using two identifiers. Pt MRN:  [id number] Pt DOB:  [birth year]  History of Present Illness:  The patient was last seen in October 2019. Her daughter, Tracy Kelly, is present during this e-visit. On her last visit, Levetiracetam dose was increased to 750mg  BID, she had a GTC in 03/2017 and continued to report frequent panic attacks. She has a calendar of her symptoms. There have been no staring/unresponsive episodes or GTCs since 03/2017. She had panic attacks periodically during the day on 11/24, she had 2 in February. At that time, she was not sleeping well due to stress/worrying. None in March. Tracy Kelly has not seen any staring/unresponsive episodes. She denies any olfactory/gustatory hallucinations, focal numbness/tingling/weakness, myoclonic jerks. No headaches, dizziness (except when standing up quickly), no falls. No side effects on current dose of Levetiracetam. Sleep is better. She had one episode of pain down her left leg, this has not recurred.   HPI: This is a pleasant 48 yo RH woman with new onset seizure last 12/25/14. She has no recollection of events,  she recalls going to work that morning, then waking up in the hospital. She and her husband work together, and he reports that on the morning of 12/25/14, prior to going to work, he noticed her standing at the door, staring and unresponsive for around 5 seconds. They went to work where she had a panic attack and another staring episode that she is amnestic of. They got home then later on he found her sitting on the couch unresponsive, then just saying "okay" when asked questions. She then stopped talking and started having lip smacking. He called 911, and when they arrived, she continued to be staring then proceeded to have a generalized tonic-clonic seizure was head turn to the right lasting 1-2 minutes. She had 3 more seizures en route and was given Versed. She was back to baseline in the ER and was started on Keppra. Bloodwork showed normal CBC except mild thrombocytopenia with platelets of 124, CMP unremarkable, UDS positive for benzodiazepines (received Versed in ambulance). 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 Routine wake and sleep EEG was normal.   Her husband reports that she has always had panic attacks since her 11s. He has also been noticing staring and unresponsive episodes for the past couple of months. She had tried Paxil for anxiety in her 39s, but felt unwell and woke up in the hospital. There have been no further seizures, staring episodes, as well as no further panic attacks/anxiety since starting Keppra 500mg  BID. She works in housekeeping. She finished 12th grade with  some special education classes.   Epilepsy Risk Factors: She had a normal birth and early development. There is no history of febrile convulsions, CNS infections such as meningitis/encephalitis, significant traumatic brain injury, neurosurgical procedures, or family history of  seizures.   Observations/Objective: The patient is awake, alert, oriented x 3. No aphasia or dysarthria. Intact fluency and comprehension. Remote and recent memory intact. Cranial nerves: pupils equal, round. Extraocular movements intact with no nystagmus. No facial asymmetry. Motor: moves all extremities symmetrically, no pronator drift. No incoordination on finger to nose testing. Gait narrow-based and steady, able to tandem walk adequately.  Assessment and Plan:   This is a pleasant 48 yo RH woman with a history of panic attacks/anxiety, as well as episodes of staring/unresponsiveness, who had several GTCs last 12/25/14 preceded by staring/unresponsiveness and lip smacking, suggestive of temporal lobe epilepsy. 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 EEG normal. Her last GTC was in December 2018 after missing a dose of Keppra. There has been a reduction in panic attacks with increase in Levetiracetam dose to 750mg  BID. Last panic attack 06/25/2018. We discussed options to increase dose versus clinically monitoring symptoms and increasing dose if panic attacks continue to increase in frequency. She would like to stay on current dose and keep seizure calendar. Refills for Levetiracetam sent to her pharmacy. She does not drive. Follow-up in 6-8 months, they know to call for any changes.   Follow Up Instructions:   -I discussed the assessment and treatment plan with the patient. The patient was provided an opportunity to ask questions and all were answered. The patient agreed with the plan and demonstrated an understanding of the instructions.   The patient was advised to call back or seek an in-person evaluation if the symptoms worsen or if the condition fails to improve as anticipated.  Total Time spent in visit with the patient was 15 minutes, of which more than 50% of the time was spent in counseling and/or coordinating care on the above.   Pt understands and agrees with the plan of  care outlined.     Van Clines, MD

## 2018-07-29 ENCOUNTER — Ambulatory Visit: Payer: BLUE CROSS/BLUE SHIELD | Admitting: Neurology

## 2018-08-09 MED FILL — levETIRAcetam 500 MG TABS: 500 | 30 days supply | Qty: 90 | Fill #6

## 2018-09-07 ENCOUNTER — Telehealth: Payer: Self-pay | Admitting: Neurology

## 2018-09-07 MED FILL — levETIRAcetam 500 MG TABS: 500 | 30 days supply | Qty: 90 | Fill #7

## 2018-09-07 NOTE — Telephone Encounter (Signed)
Pt has refills on file at the pharmacy they are working on filling it for her now, she can go to the pharmacy today and pick it up, they are open for pt to pick up medication,  Tried calling pt back to inform her no answer voice mail left for her to call back,

## 2018-09-07 NOTE — Telephone Encounter (Signed)
Pt called back she was informed that pharmacy was working on her script and that it should be ready for her today

## 2018-09-07 NOTE — Telephone Encounter (Signed)
Patient called regarding needing a refill on her Keppra medication. She is scheduled for a follow up in November. She uses [facility]. She said she is needing the medication by Friday. She said the pharmacy is not open for pick up. She is concerned how she will get her medication. Please Call. Thanks

## 2018-10-04 ENCOUNTER — Telehealth: Payer: Self-pay | Admitting: Neurology

## 2018-10-04 NOTE — Telephone Encounter (Signed)
I'm not quite sure which medication she means, can you pls confirm, thanks

## 2018-10-04 NOTE — Telephone Encounter (Signed)
Keppra interfere with cycryne hyclate? Please call her back at [phone]. Thanks!

## 2018-10-05 NOTE — Telephone Encounter (Signed)
Pt called asking if it was ok for her to take doxycyline with her [facility] Mr Delice Lesch was asked, pt informed that It is ok for her to take pt verbalized understanding no other questions at this time

## 2018-10-05 NOTE — Telephone Encounter (Signed)
Pt called no answer voice mail left for her to call back   

## 2018-10-06 MED FILL — levETIRAcetam 500 MG TABS: 500 | 30 days supply | Qty: 90 | Fill #8

## 2018-11-08 MED FILL — levETIRAcetam 500 MG TABS: 500 | 30 days supply | Qty: 90 | Fill #9

## 2018-12-08 MED FILL — levETIRAcetam 500 MG TABS: 500 | 30 days supply | Qty: 90 | Fill #10

## 2019-01-10 MED FILL — levETIRAcetam 500 MG TABS: 500 | 30 days supply | Qty: 90 | Fill #11

## 2019-01-31 ENCOUNTER — Telehealth: Payer: Self-pay | Admitting: Neurology

## 2019-01-31 ENCOUNTER — Other Ambulatory Visit: Payer: Self-pay

## 2019-01-31 DIAGNOSIS — G40209 Localization-related (focal) (partial) symptomatic epilepsy and epileptic syndromes with complex partial seizures, not intractable, without status epilepticus: Secondary | ICD-10-CM

## 2019-01-31 MED ORDER — LEVETIRACETAM 500 MG PO TABS: ORAL_TABLET | ORAL | 3 refills | Status: DC

## 2019-01-31 MED FILL — levETIRAcetam 500 MG TABS: 500 | 30 days supply | Qty: 90 | Fill #0

## 2019-01-31 NOTE — Telephone Encounter (Signed)
Keppra 500mg  sent to [facility]

## 2019-01-31 NOTE — Telephone Encounter (Signed)
Patient needs a refill on the keppra sent to the [facility] she will run out on 02-11-19 and has a appt in Nov with Delice Lesch

## 2019-03-01 ENCOUNTER — Other Ambulatory Visit: Payer: Self-pay

## 2019-03-01 ENCOUNTER — Ambulatory Visit (INDEPENDENT_AMBULATORY_CARE_PROVIDER_SITE_OTHER): Payer: Self-pay | Admitting: Neurology

## 2019-03-01 ENCOUNTER — Encounter: Payer: Self-pay | Admitting: Neurology

## 2019-03-01 DIAGNOSIS — G40209 Localization-related (focal) (partial) symptomatic epilepsy and epileptic syndromes with complex partial seizures, not intractable, without status epilepticus: Secondary | ICD-10-CM

## 2019-03-01 MED ORDER — LEVETIRACETAM 500 MG PO TABS: ORAL_TABLET | ORAL | 3 refills | Status: DC

## 2019-03-01 MED FILL — levETIRAcetam 500 MG TABS: 500 | 30 days supply | Qty: 120 | Fill #0

## 2019-03-01 NOTE — Progress Notes (Signed)
NEUROLOGY FOLLOW UP OFFICE NOTE  Tracy Kelly [id number] 04-02-1971  HISTORY OF PRESENT ILLNESS: I had the pleasure of seeing Tracy Kelly in follow-up in the neurology clinic on 03/01/2019.  The patient was last seen 8 months ago for seizures. 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 She denies any convulsions since 03/2017. She is on Levetiracetam 750mg  BID with no side effects. She continues to report panic attacks, she had one a couple of months ago, then has had several since last night, she woke up with one at 3:30am and has had it on and off all day today. She denies missing any medications, sleep deprivation, or alcohol use. No staring/unresponsive episodes, gaps in time, olfactory/gustatory hallucinations, focal numbness/tingling/weakness, myoclonic jerks. She had a headache last night and took a BC powder. No vision changes. She reports nerve pain on her left side.   History on Initial Assessment 03/16/2015: This is a pleasant 48 yo RH woman with new onset seizure last 12/25/14. She has no recollection of events, she recalls going to work that morning, then waking up in the hospital. She and her husband work together, and he reports that on the morning of 12/25/14, prior to going to work, he noticed her standing at the door, staring and unresponsive for around 5 seconds. They went to work where she had a panic attack and another staring episode that she is amnestic of. They got home then later on he found her sitting on the couch unresponsive, then just saying "okay" when asked questions. She then stopped talking and started having lip smacking. He called 911, and when they arrived, she continued to be staring then proceeded to have a generalized tonic-clonic seizure was head turn to the right lasting 1-2 minutes. She had 3 more seizures en route and was given Versed. She was back to baseline in the ER and was  started on Keppra. Bloodwork showed normal CBC except mild thrombocytopenia with platelets of 124, CMP unremarkable, UDS positive for benzodiazepines (received Versed in ambulance). 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 Routine wake and sleep EEG was normal.   Her husband reports that she has always had panic attacks since her 59s. He has also been noticing staring and unresponsive episodes for the past couple of months. She had tried Paxil for anxiety in her 46s, but felt unwell and woke up in the hospital. There have been no further seizures, staring episodes, as well as no further panic attacks/anxiety since starting Keppra 500mg  BID. 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   Epilepsy Risk Factors: She had a normal birth and early development. There is no history of febrile convulsions, CNS infections such as meningitis/encephalitis, significant traumatic brain injury, neurosurgical procedures, or family history of seizures.  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  Routine wake and sleep EEG 11/2014 was normal.     PAST MEDICAL HISTORY: Past Medical History:  Diagnosis Date  . Anxiety   . Seizures (HCC)     MEDICATIONS: Current Outpatient Medications on File Prior to Visit  Medication Sig Dispense Refill  . acetaminophen (TYLENOL) 500 MG tablet Take 500 mg by mouth  every 6 (six) hours as needed for headache.    Marland Kitchen BIOTIN 5000 PO Take 1 tablet by mouth daily.    Marland Kitchen levETIRAcetam (KEPPRA) 500 MG tablet Take 1.5 tablets twice a day 270 tablet 3   No current facility-administered medications on file prior to visit.     ALLERGIES: Allergies  Allergen  Reactions  . Amoxicillin     hives  . Penicillins     hives    FAMILY HISTORY: History reviewed. No pertinent family history.  SOCIAL HISTORY: Social History   Socioeconomic History  . Marital status: Married    Spouse name: Not on file  . Number of children: 1  . Years of education: Not on file  . Highest education level: Not on file  Occupational History  . Occupation: Secretary/administrator  Social Needs  . Financial resource strain: Not on file  . Food insecurity    Worry: Not on file    Inability: Not on file  . Transportation needs    Medical: Not on file    Non-medical: Not on file  Tobacco Use  . Smoking status: Current Every Day Smoker    Types: Cigarettes  . Smokeless tobacco: Never Used  Substance and Sexual Activity  . Alcohol use: Yes    Alcohol/week: 0.0 standard drinks  . Drug use: No  . Sexual activity: Not on file  Lifestyle  . Physical activity    Days per week: Not on file    Minutes per session: Not on file  . Stress: Not on file  Relationships  . Social Herbalist on phone: Not on file    Gets together: Not on file    Attends religious service: Not on file    Active member of club or organization: Not on file    Attends meetings of clubs or organizations: Not on file    Relationship status: Not on file  . Intimate partner violence    Fear of current or ex partner: Not on file    Emotionally abused: Not on file    Physically abused: Not on file    Forced sexual activity: Not on file  Other Topics Concern  . Not on file  Social History Narrative  . Not on file    REVIEW OF SYSTEMS: Constitutional: No fevers, chills, or sweats, no generalized fatigue, change in appetite Eyes: No visual changes, double vision, eye pain Ear, nose and throat: No hearing loss, ear pain, nasal congestion, sore throat Cardiovascular: No chest pain, palpitations Respiratory:  No shortness of breath at rest or with exertion, wheezes GastrointestinaI: No  nausea, vomiting, diarrhea, abdominal pain, fecal incontinence Genitourinary:  No dysuria, urinary retention or frequency Musculoskeletal:  No neck pain, back pain Integumentary: No rash, pruritus, skin lesions Neurological: as above Psychiatric: No depression, insomnia, anxiety Endocrine: No palpitations, fatigue, diaphoresis, mood swings, change in appetite, change in weight, increased thirst Hematologic/Lymphatic:  No anemia, purpura, petechiae. Allergic/Immunologic: no itchy/runny eyes, nasal congestion, recent allergic reactions, rashes  PHYSICAL EXAM: Vitals:   03/01/19 1540  BP: 99/68  Pulse: 86   General: No acute distress Head:  Normocephalic/atraumatic Skin/Extremities: No rash, no edema Neurological Exam: alert and oriented to person, place, and time. No aphasia or dysarthria. Fund of knowledge is appropriate.  Recent and remote memory are intact.  Attention and concentration are normal.    Able to name objects and repeat phrases. Cranial nerves: Pupils equal, round. Extraocular movements intact with no nystagmus. Visual fields full.  No facial asymmetry. Tongue, uvula, palate midline.  Motor: Bulk and tone normal, muscle strength 5/5 throughout with no pronator drift. Deep tendon reflexes 2+ throughout, toes downgoing.  Finger to nose testing intact.  Gait narrow-based and steady, able to tandem walk adequately.  Romberg negative.   IMPRESSION: This is a pleasant 48 yo RH woman with a history of panic attacks/anxiety, as well as episodes of staring/unresponsiveness, who had several GTCs last 12/25/14 preceded by staring/unresponsiveness and lip smacking, suggestive of temporal lobe epilepsy. 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 EEG normal. Last GTC in 03/2017, however she continues to report panic attacks which are likely simple partial seizures. Increase Levetiracetam to 1000mg  BID. She was advised to keep a symptom calendar. She does not drive. Follow-up in 6  months, she knows to call for any changes.    Thank you for allowing me to participate in her care.  Please do not hesitate to call for any questions or concerns.   Patrcia DollyKaren Domitila Stetler, M.D.   CC: [facility]

## 2019-03-01 NOTE — Patient Instructions (Signed)
1. Increase Keppra (Levetiracetam) 500mg : take 2 tablets twice a day  2. Keep a calendar of the panic attacks and see if they lessen with higher dose of medication  3. Follow-up in 6 months, call for any changes  Seizure Precautions: 1. If medication has been prescribed for you to prevent seizures, take it exactly as directed.  Do not stop taking the medicine without talking to your doctor first, even if you have not had a seizure in a long time.   2. Avoid activities in which a seizure would cause danger to yourself or to others.  Don't operate dangerous machinery, swim alone, or climb in high or dangerous places, such as on ladders, roofs, or girders.  Do not drive unless your doctor says you may.  3. If you have any warning that you may have a seizure, lay down in a safe place where you can't hurt yourself.    4.  No driving for 6 months from last seizure, as per [facility].   Please refer to the following link on the [place] website for more information: http://www.epilepsyfoundation.org/answerplace/Social/driving/drivingu.cfm   5.  Maintain good sleep hygiene. Avoid alcohol.  6.  Contact your doctor if you have any problems that may be related to the medicine you are taking.  7.  Call 911 and bring the patient back to the ED if:        A.  The seizure lasts longer than 5 minutes.       B.  The patient doesn't awaken shortly after the seizure  C.  The patient has new problems such as difficulty seeing, speaking or moving  D.  The patient was injured during the seizure  E.  The patient has a temperature over 102 F (39C)  F.  The patient vomited and now is having trouble breathing

## 2019-03-08 ENCOUNTER — Encounter: Payer: Self-pay | Admitting: Neurology

## 2019-03-30 MED FILL — levETIRAcetam 500 MG TABS: 500 | 30 days supply | Qty: 120 | Fill #1

## 2019-04-01 MED FILL — levETIRAcetam 500 MG TABS: 500 | 30 days supply | Qty: 120 | Fill #2

## 2019-05-05 MED FILL — levETIRAcetam 500 MG TABS: 500 | 30 days supply | Qty: 120 | Fill #3

## 2019-05-18 ENCOUNTER — Telehealth: Payer: Self-pay | Admitting: Neurology

## 2019-05-18 NOTE — Telephone Encounter (Signed)
Patient called in regarding her Keppra medication. She cannot remember if she has taken it already this morning? Please Call. Thank you

## 2019-05-18 NOTE — Telephone Encounter (Signed)
Left voicemail instructing patient can take a dose now, then take regular dose tonight. She may just feel a little more tired. Instructed to start using a pillbox so she can track down medication intake.

## 2019-06-08 MED FILL — levETIRAcetam 500 MG TABS: 500 | 30 days supply | Qty: 120 | Fill #4

## 2019-07-06 MED FILL — levETIRAcetam 500 MG TABS: 500 | 30 days supply | Qty: 120 | Fill #5

## 2019-08-05 MED FILL — levETIRAcetam 500 MG TABS: 500 | 30 days supply | Qty: 120 | Fill #6

## 2019-08-29 ENCOUNTER — Encounter: Payer: Self-pay | Admitting: Neurology

## 2019-08-29 ENCOUNTER — Ambulatory Visit (INDEPENDENT_AMBULATORY_CARE_PROVIDER_SITE_OTHER): Payer: BLUE CROSS/BLUE SHIELD | Admitting: Neurology

## 2019-08-29 ENCOUNTER — Other Ambulatory Visit: Payer: Self-pay

## 2019-08-29 DIAGNOSIS — G40209 Localization-related (focal) (partial) symptomatic epilepsy and epileptic syndromes with complex partial seizures, not intractable, without status epilepticus: Secondary | ICD-10-CM | POA: Diagnosis not present

## 2019-08-29 MED ORDER — LEVETIRACETAM 500 MG PO TABS: ORAL_TABLET | ORAL | 3 refills | Status: DC

## 2019-08-29 NOTE — Progress Notes (Signed)
NEUROLOGY FOLLOW UP OFFICE NOTE  ARCHIE ATILANO [id number] 1971/04/28  HISTORY OF PRESENT ILLNESS: I had the pleasure of seeing Myda Banton in follow-up in the neurology clinic on 08/29/2019.  The patient was last seen 6 months ago for seizures. 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 She is accompanied by her daughter Danae Chen who helps supplement the history today. On her last visit, she reported an increase in panic attacks, no convulsions since 03/2017. Dose of Levetiracetam increased to 1000mg  BID.  Her daughter has not witnessed any staring/unresponsive episodes. Since her last visit, she reports a panic attack in Dec, 4/27, 4/28 (two) where her heart beats fast for 30 seconds. No associated confusion or speech difficulties. She denies any olfactory/gustatory hallucinations, focal numbness/tingling/weakness, myoclonic jerks. No headaches, dizziness, vision changes, no falls. No side effects on LEV, she denies missing any doses.   History on Initial Assessment 03/16/2015: This is a pleasant 49 yo RH woman with new onset seizure last 12/25/14. She has no recollection of events, she recalls going to work that morning, then waking up in the hospital. She and her husband work together, and he reports that on the morning of 12/25/14, prior to going to work, he noticed her standing at the door, staring and unresponsive for around 5 seconds. They went to work where she had a panic attack and another staring episode that she is amnestic of. They got home then later on he found her sitting on the couch unresponsive, then just saying "okay" when asked questions. She then stopped talking and started having lip smacking. He called 911, and when they arrived, she continued to be staring then proceeded to have a generalized tonic-clonic seizure was head turn to the right lasting 1-2 minutes. She had 3 more seizures en route and was  given Versed. She was back to baseline in the ER and was started on Keppra. Bloodwork showed normal CBC except mild thrombocytopenia with platelets of 124, CMP unremarkable, UDS positive for benzodiazepines (received Versed in ambulance). 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 Routine wake and sleep EEG was normal.   Her husband reports that she has always had panic attacks since her 47s. He has also been noticing staring and unresponsive episodes for the past couple of months. She had tried Paxil for anxiety in her 47s, but felt unwell and woke up in the hospital. There have been no further seizures, staring episodes, as well as no further panic attacks/anxiety since starting Keppra 500mg  BID. 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   Epilepsy Risk Factors: She had a normal birth and early development. There is no history of febrile convulsions, CNS infections such as meningitis/encephalitis, significant traumatic brain injury, neurosurgical procedures, or family history of seizures.  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  Routine wake and sleep EEG 11/2014 was normal.    PAST MEDICAL HISTORY: Past Medical History:  Diagnosis Date  . Anxiety   . Seizures ([place])     MEDICATIONS: Current Outpatient Medications on File Prior to Visit  Medication Sig Dispense Refill  . acetaminophen (TYLENOL) 500 MG tablet Take 500 mg by mouth every  6 (six) hours as needed for headache.    Marland Kitchen BIOTIN 5000 PO Take 1 tablet by mouth daily.    Marland Kitchen levETIRAcetam (KEPPRA) 500 MG tablet Take 2 tablets twice a day 360 tablet 3   No current facility-administered medications on file prior  to visit.    ALLERGIES: Allergies  Allergen Reactions  . Amoxicillin     hives  . Penicillins     hives    FAMILY HISTORY: No family history on file.  SOCIAL HISTORY: Social History   Socioeconomic History  . Marital status: Married    Spouse name: Not on file  . Number of children: 1  . Years of education: Not on file  . Highest education level: Not on file  Occupational History  . Occupation: Housekeeper  Tobacco Use  . Smoking status: Current Every Day Smoker    Types: Cigarettes  . Smokeless tobacco: Never Used  Substance and Sexual Activity  . Alcohol use: Yes    Alcohol/week: 0.0 standard drinks  . Drug use: No  . Sexual activity: Not on file  Other Topics Concern  . Not on file  Social History Narrative   Right handed   HS   Lives with husband and daughter in one story home   Social Determinants of Health   Financial Resource Strain:   . Difficulty of Paying Living Expenses:   Food Insecurity:   . Worried About Programme researcher, broadcasting/film/video in the Last Year:   . Barista in the Last Year:   Transportation Needs:   . Freight forwarder (Medical):   Marland Kitchen Lack of Transportation (Non-Medical):   Physical Activity:   . Days of Exercise per Week:   . Minutes of Exercise per Session:   Stress:   . Feeling of Stress :   Social Connections:   . Frequency of Communication with Friends and Family:   . Frequency of Social Gatherings with Friends and Family:   . Attends Religious Services:   . Active Member of Clubs or Organizations:   . Attends Banker Meetings:   Marland Kitchen Marital Status:   Intimate Partner Violence:   . Fear of Current or Ex-Partner:   . Emotionally Abused:   Marland Kitchen Physically Abused:   . Sexually Abused:     REVIEW OF SYSTEMS: Constitutional: No fevers, chills, or sweats, no generalized fatigue, change in appetite Eyes: No visual changes, double vision, eye pain Ear, nose and throat: No hearing loss, ear pain, nasal congestion,  sore throat Cardiovascular: No chest pain, palpitations Respiratory:  No shortness of breath at rest or with exertion, wheezes GastrointestinaI: No nausea, vomiting, diarrhea, abdominal pain, fecal incontinence Genitourinary:  No dysuria, urinary retention or frequency Musculoskeletal:  No neck pain, back pain Integumentary: No rash, pruritus, skin lesions Neurological: as above Psychiatric: No depression, insomnia, anxiety Endocrine: No palpitations, fatigue, diaphoresis, mood swings, change in appetite, change in weight, increased thirst Hematologic/Lymphatic:  No anemia, purpura, petechiae. Allergic/Immunologic: no itchy/runny eyes, nasal congestion, recent allergic reactions, rashes  PHYSICAL EXAM: Vitals:   08/29/19 1438  BP: 104/70  Pulse: 79  SpO2: 97%   General: No acute distress Head:  Normocephalic/atraumatic Skin/Extremities: No rash, no edema Neurological Exam: alert and oriented to person, place, and time. No aphasia or dysarthria. Fund of knowledge is appropriate.  Recent and remote memory are intact.  Attention and concentration are normal.   Cranial nerves: Pupils equal, round, reactive to light. Extraocular movements intact with no nystagmus. Visual  fields full. No facial asymmetry. Motor: Bulk and tone normal, muscle strength 5/5 throughout with no pronator drift.  Finger to nose testing intact.  Gait narrow-based and steady, able to tandem walk adequately.   IMPRESSION: This is a pleasant 49 yo RH woman with a history of panic attacks/anxiety, as well as episodes of staring/unresponsiveness, who had several GTCs last 12/25/14 preceded by staring/unresponsiveness and lip smacking, suggestive of temporal lobe epilepsy. 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 EEG normal. No GTCs since 03/2017, she continues to report rare episodes concerning for simple partial seizures, we have agreed to continue on current dose of Levetiracetam 1000mg  BID and continue  symptom calendar, monitor for triggers (ie sleep). She does not drive. Follow-up in 6 months, they know to call for any changes.    Thank you for allowing me to participate in her care.  Please do not hesitate to call for any questions or concerns.   , M.D.   CC: [facility]

## 2019-08-29 NOTE — Patient Instructions (Signed)
1. Continue Keppra 500mg : Take 2 tablets twice a day  2. Continue symptom calendar. Keep track of your sleep hours during the days you have a panic attack  3. Follow-up in 6 months, call for any changes  Seizure Precautions: 1. If medication has been prescribed for you to prevent seizures, take it exactly as directed.  Do not stop taking the medicine without talking to your doctor first, even if you have not had a seizure in a long time.   2. Avoid activities in which a seizure would cause danger to yourself or to others.  Don't operate dangerous machinery, swim alone, or climb in high or dangerous places, such as on ladders, roofs, or girders.  Do not drive unless your doctor says you may.  3. If you have any warning that you may have a seizure, lay down in a safe place where you can't hurt yourself.    4.  No driving for 6 months from last seizure, as per [facility].   Please refer to the following link on the Epilepsy Foundation of America's website for more information: http://www.epilepsyfoundation.org/answerplace/Social/driving/drivingu.cfm   5.  Maintain good sleep hygiene. Avoid alcohol.  6.  Notify your neurology if you are planning pregnancy or if you become pregnant.  7.  Contact your doctor if you have any problems that may be related to the medicine you are taking.  8.  Call 911 and bring the patient back to the ED if:        A.  The seizure lasts longer than 5 minutes.       B.  The patient doesn't awaken shortly after the seizure  C.  The patient has new problems such as difficulty seeing, speaking or moving  D.  The patient was injured during the seizure  E.  The patient has a temperature over 102 F (39C)  F.  The patient vomited and now is having trouble breathing

## 2019-11-04 MED FILL — levETIRAcetam 500 MG TABS: 500 | 30 days supply | Qty: 120 | Fill #9

## 2019-11-08 ENCOUNTER — Other Ambulatory Visit: Payer: Self-pay | Admitting: Neurology

## 2019-11-08 ENCOUNTER — Other Ambulatory Visit: Payer: Self-pay

## 2019-11-08 DIAGNOSIS — G40209 Localization-related (focal) (partial) symptomatic epilepsy and epileptic syndromes with complex partial seizures, not intractable, without status epilepticus: Secondary | ICD-10-CM

## 2019-11-08 MED ORDER — LEVETIRACETAM 500 MG PO TABS: ORAL_TABLET | ORAL | 0 refills | Status: DC

## 2019-11-08 NOTE — Telephone Encounter (Signed)
Refill sent to pharmacy on file

## 2019-11-08 NOTE — Telephone Encounter (Signed)
Patient needs refill of Keppra 500mg  sent to [facility].

## 2019-12-06 MED FILL — levETIRAcetam 500 MG TABS: 500 | 30 days supply | Qty: 120 | Fill #10

## 2019-12-12 IMAGING — CT CT HEAD W/O CM
4 series · 16 of 47 positions shown, 18 images · non-contrast
Comparison: 12/26/2014, 12/25/2014

CLINICAL DATA: Pt husband reports pt had a seizure this am. Pt does
not remember. HX seizures

EXAM:
CT HEAD WITHOUT CONTRAST
TECHNIQUE: Contiguous axial images were obtained from the base of the skull
through the vertex without intravenous contrast.

[Series 3: head without · axial · non-contrast · 0.46mm/px · z∈[-100,+20]mm · 7 of 32 slices shown, 9 images]
[im 4/32  brain]
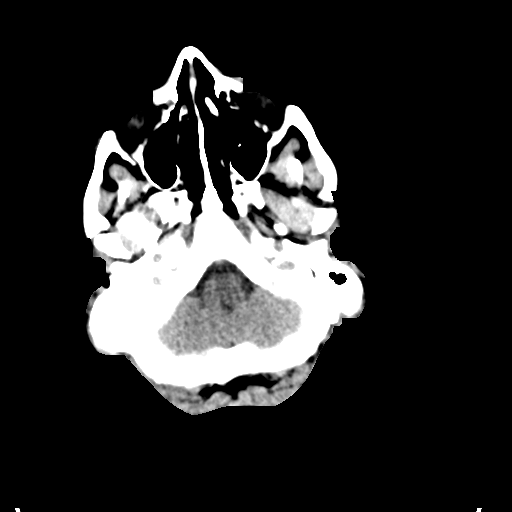
[im 4/32  bone]
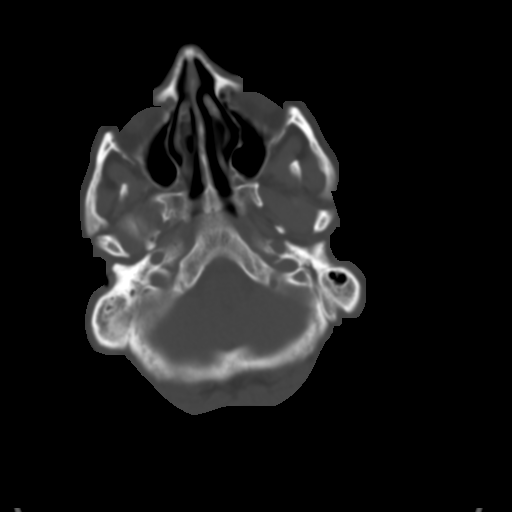
[im 8/32  brain]
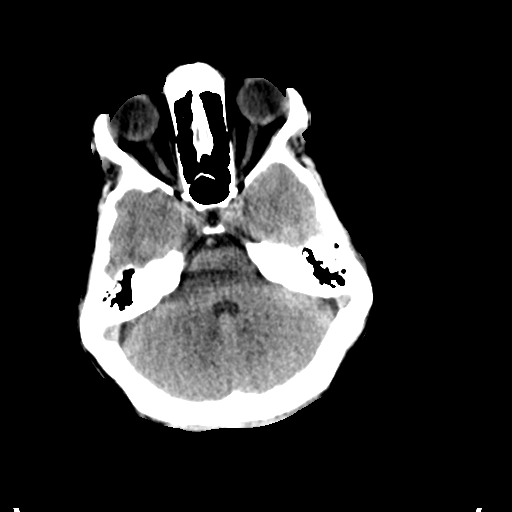
[im 12/32  brain]
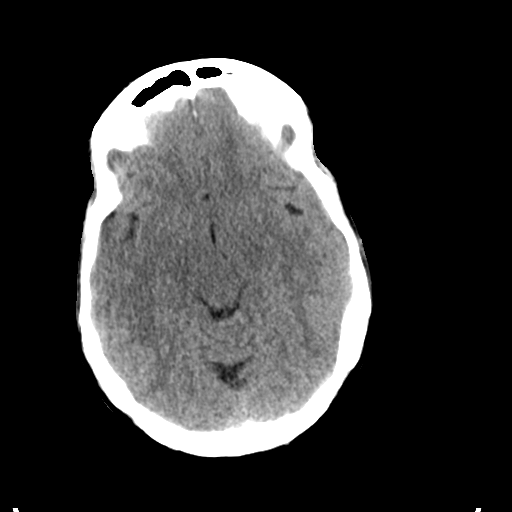
[im 16/32  brain]
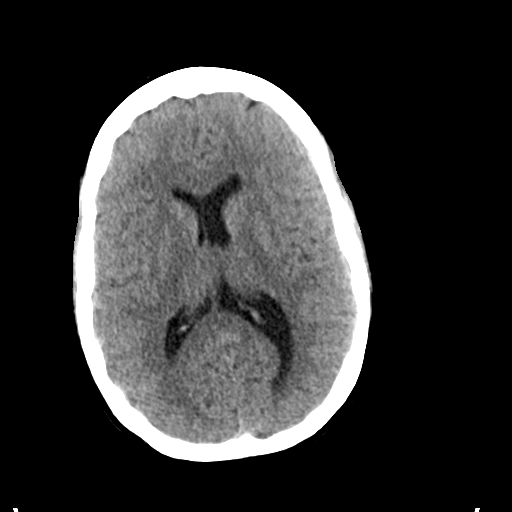
[im 20/32  brain]
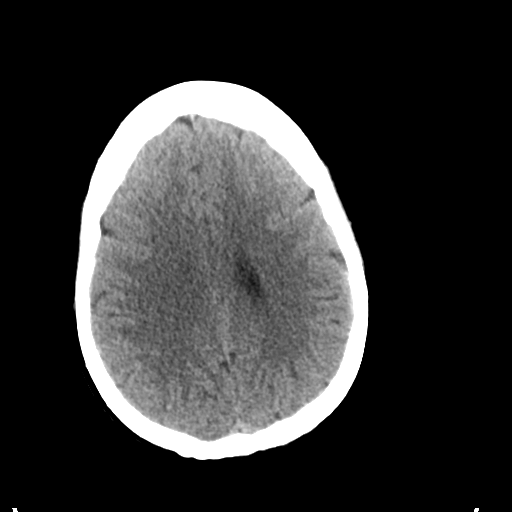
[im 20/32  bone]
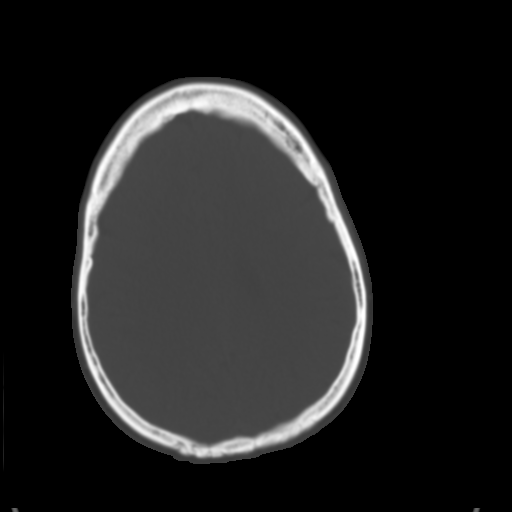
[im 24/32  brain]
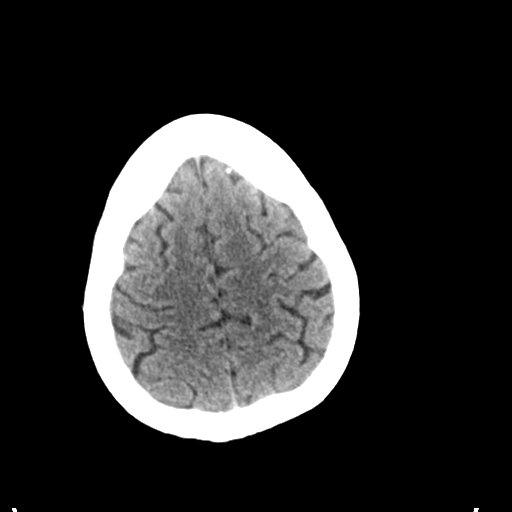
[im 28/32  brain]
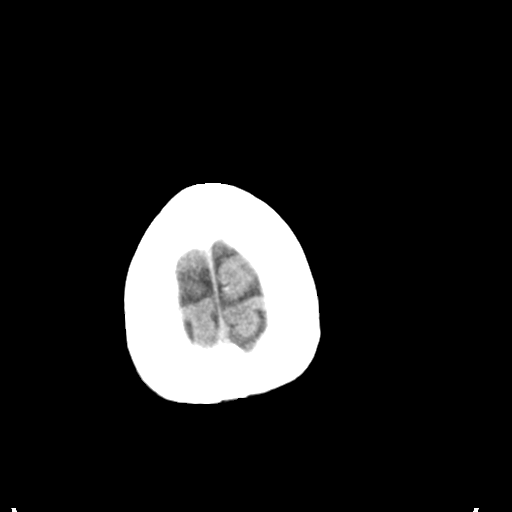

[Series 4: head bone · axial · 0.46mm/px · z∈[-101,-69]mm · 3 of 79 slices shown]
[im 8/79  bone]
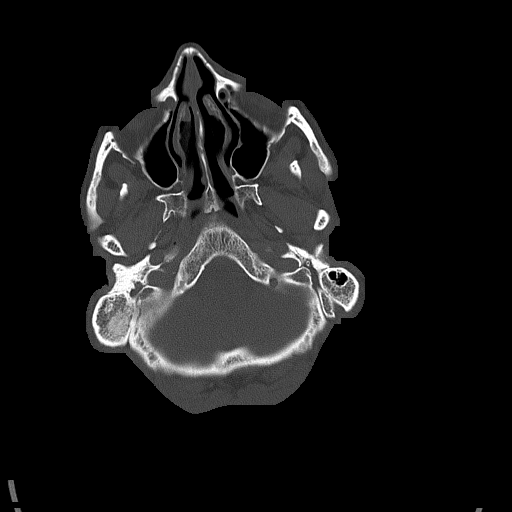
[im 16/79  bone]
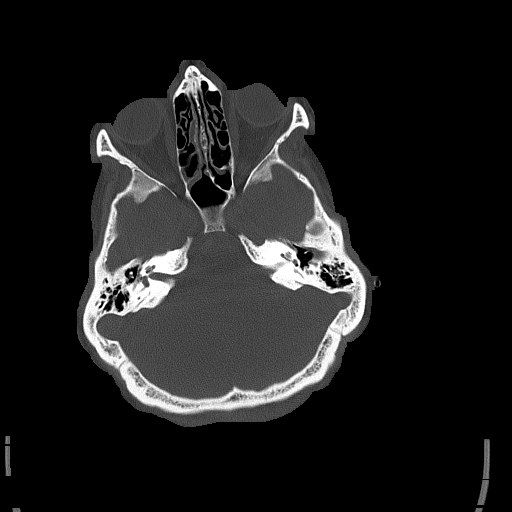
[im 24/79  bone]
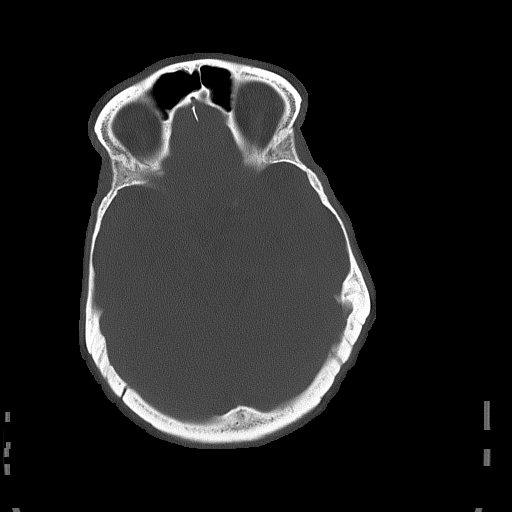

[Series 5: head without cor · coronal · non-contrast · 0.31mm/px · 3 of 67 slices shown]
[im 23/67  brain]
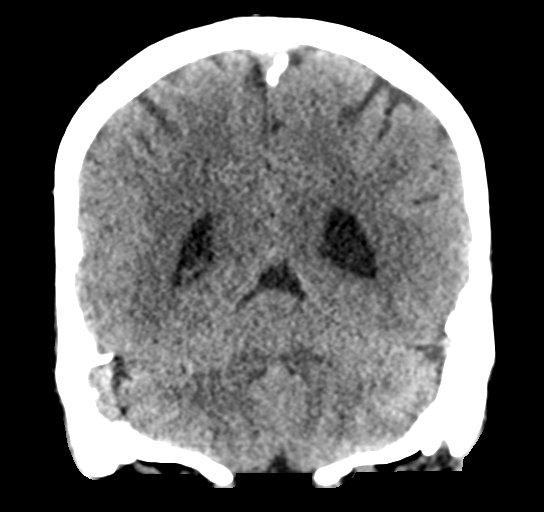
[im 30/67  brain]
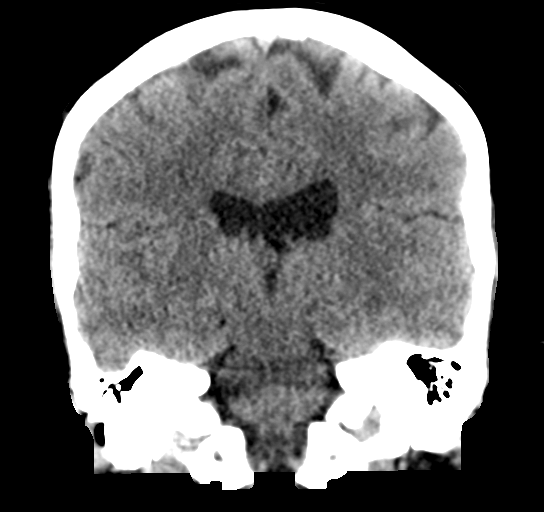
[im 37/67  brain]
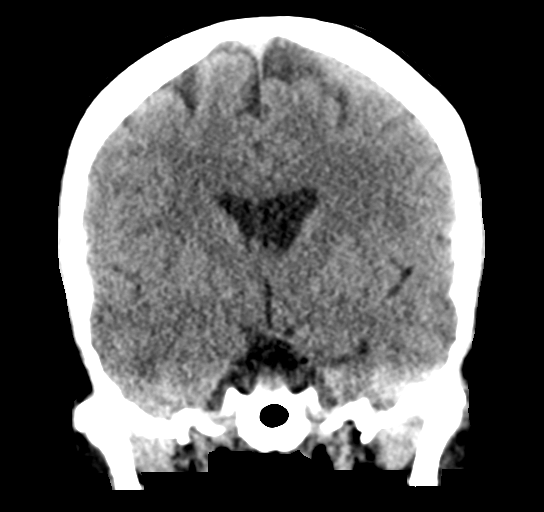

[Series 6: head without sag · sagittal · non-contrast · 0.31mm/px · 3 of 49 slices shown]
[im 17/49  brain]
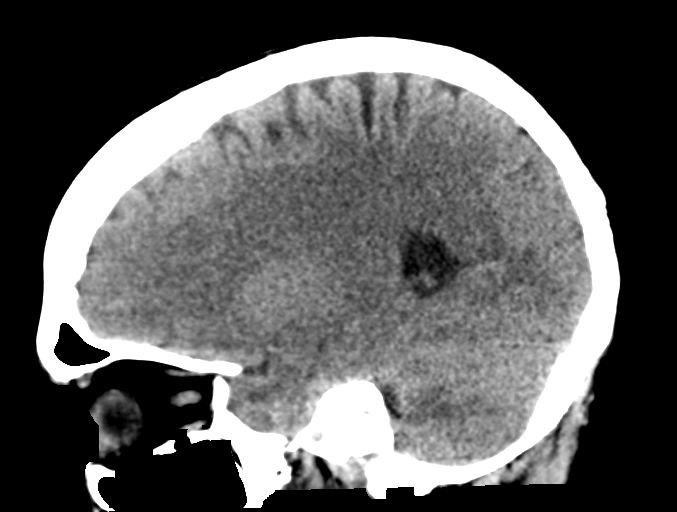
[im 25/49  brain]
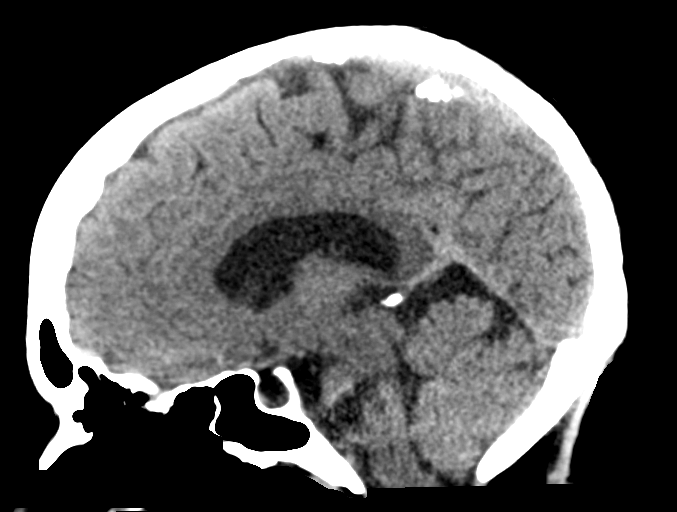
[im 33/49  brain]
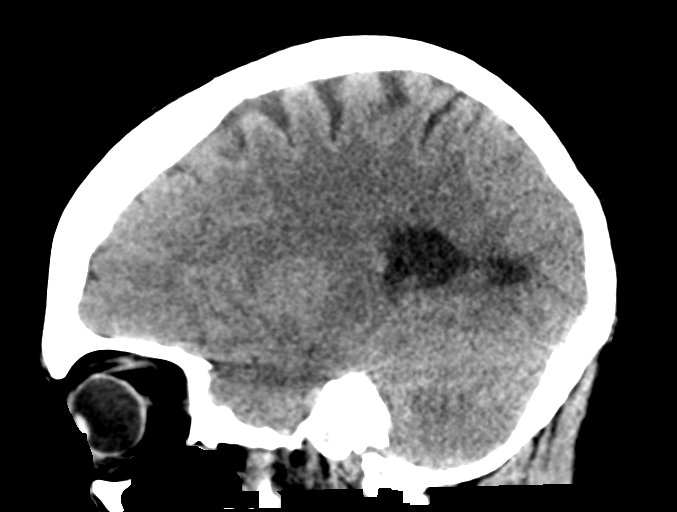

[16 of 47 positions shown; findings below may reference images not displayed]

FINDINGS: Brain: No evidence of acute infarction, hemorrhage, hydrocephalus,
extra-axial collection or mass lesion/mass effect.

Vascular: No hyperdense vessel or unexpected calcification.

Skull: Normal. Negative for fracture or focal lesion.

Sinuses/Orbits: No acute finding.

Other: There is cerumen within bilateralexternal auditory canals.
IMPRESSION: 1.  No evidence for acute intracranial abnormality.
2. Cerumen in bilateral external auditory canals.

## 2019-12-21 ENCOUNTER — Ambulatory Visit: Payer: BLUE CROSS/BLUE SHIELD | Admitting: Nurse Practitioner

## 2020-01-03 MED FILL — levETIRAcetam 500 MG TABS: 500 | 30 days supply | Qty: 120 | Fill #11

## 2020-02-02 ENCOUNTER — Other Ambulatory Visit: Payer: Self-pay

## 2020-02-02 ENCOUNTER — Other Ambulatory Visit: Payer: Self-pay | Admitting: Neurology

## 2020-02-02 DIAGNOSIS — G40209 Localization-related (focal) (partial) symptomatic epilepsy and epileptic syndromes with complex partial seizures, not intractable, without status epilepticus: Secondary | ICD-10-CM

## 2020-02-02 MED ORDER — LEVETIRACETAM 500 MG PO TABS: ORAL_TABLET | ORAL | 0 refills | Status: DC

## 2020-02-02 MED FILL — levETIRAcetam 500 MG TABS: 500 | 30 days supply | Qty: 120 | Fill #0

## 2020-02-02 NOTE — Telephone Encounter (Signed)
Patient called in stating she is going to run out of her Keppra in a week or so. She would like a refill to get her to her next appointment on 03/14/20

## 2020-02-02 NOTE — Telephone Encounter (Signed)
Refill sent to pharmacy on file

## 2020-02-06 ENCOUNTER — Other Ambulatory Visit: Payer: Self-pay | Admitting: Neurology

## 2020-02-06 ENCOUNTER — Other Ambulatory Visit: Payer: Self-pay

## 2020-02-06 DIAGNOSIS — G40209 Localization-related (focal) (partial) symptomatic epilepsy and epileptic syndromes with complex partial seizures, not intractable, without status epilepticus: Secondary | ICD-10-CM

## 2020-02-06 MED ORDER — LEVETIRACETAM 500 MG PO TABS: ORAL_TABLET | ORAL | 0 refills | Status: DC

## 2020-02-06 NOTE — Telephone Encounter (Signed)
Pt needs a refill on her Keppra she uses the [facility]

## 2020-02-06 NOTE — Telephone Encounter (Signed)
Refill sent to pharmacy.   

## 2020-02-07 ENCOUNTER — Telehealth: Payer: Self-pay | Admitting: Neurology

## 2020-02-07 NOTE — Telephone Encounter (Signed)
I called patient back to get insurance information for PA of medication; none on file. She said that she does not have insurance therefore I can't run a PA. She asked about how much medication would cost. I advised patient to call her pharmacy to see what the self pay cost would be for her medication.

## 2020-02-07 NOTE — Telephone Encounter (Signed)
Patient called in stating she has tried to call and verify if she can pick up her Keppra at her pharmacy, but they say she doesn't have authorization for it.

## 2020-03-06 MED FILL — levETIRAcetam 500 MG TABS: 500 | 30 days supply | Qty: 120 | Fill #0

## 2020-03-14 ENCOUNTER — Other Ambulatory Visit: Payer: Self-pay

## 2020-03-14 ENCOUNTER — Encounter: Payer: Self-pay | Admitting: Neurology

## 2020-03-14 ENCOUNTER — Ambulatory Visit: Payer: Self-pay | Admitting: Neurology

## 2020-03-14 VITALS — BP 100/67 | HR 105 | Ht 66.0 in | Wt 148.2 lb

## 2020-03-14 DIAGNOSIS — G40209 Localization-related (focal) (partial) symptomatic epilepsy and epileptic syndromes with complex partial seizures, not intractable, without status epilepticus: Secondary | ICD-10-CM

## 2020-03-14 MED ORDER — LEVETIRACETAM 500 MG PO TABS: ORAL_TABLET | ORAL | 11 refills | Status: DC

## 2020-03-14 NOTE — Progress Notes (Signed)
NEUROLOGY FOLLOW UP OFFICE NOTE  Tracy Kelly [id number] 12-18-1970  HISTORY OF PRESENT ILLNESS: I had the pleasure of seeing Tracy Kelly in follow-up in the neurology clinic on 03/14/2020. She is accompanied by her husband who helps supplement the history today. 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 She has episodes of panic attacks/anxiety. No convulsions since 03/2017. On her last visit, she was reporting panic attacks, she denies any since April 2021. No side effects on Levetiracetam 1000mg  BID. She and her husband deny any staring/unresponsive episodes, gaps in time, olfactory/gustatory hallucinations, focal numbness/tingling/weakness, myoclonic jerks. No headaches, dizziness. She had a fall on the driveway 1.5 months ago. Sleep is off and on.   History on Initial Assessment 03/16/2015: This is a pleasant 49 yo RH woman with new onset seizure last 12/25/14. She has no recollection of events, she recalls going to work that morning, then waking up in the hospital. She and her husband work together, and he reports that on the morning of 12/25/14, prior to going to work, he noticed her standing at the door, staring and unresponsive for around 5 seconds. They went to work where she had a panic attack and another staring episode that she is amnestic of. They got home then later on he found her sitting on the couch unresponsive, then just saying "okay" when asked questions. She then stopped talking and started having lip smacking. He called 911, and when they arrived, she continued to be staring then proceeded to have a generalized tonic-clonic seizure was head turn to the right lasting 1-2 minutes. She had 3 more seizures en route and was given Versed. She was back to baseline in the ER and was started on Keppra. Bloodwork showed normal CBC except mild thrombocytopenia with platelets of 124, CMP unremarkable, UDS positive for benzodiazepines (received  Versed in ambulance). 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 Routine wake and sleep EEG was normal.   Her husband reports that she has always had panic attacks since her 65s. He has also been noticing staring and unresponsive episodes for the past couple of months. She had tried Paxil for anxiety in her 89s, but felt unwell and woke up in the hospital. There have been no further seizures, staring episodes, as well as no further panic attacks/anxiety since starting Keppra 500mg  BID. 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   Epilepsy Risk Factors: She had a normal birth and early development. There is no history of febrile convulsions, CNS infections such as meningitis/encephalitis, significant traumatic brain injury, neurosurgical procedures, or family history of seizures.  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  Routine wake and sleep EEG 11/2014 was normal.    PAST MEDICAL HISTORY: Past Medical History:  Diagnosis Date  . Anxiety   . Seizures ([facility])     MEDICATIONS: Current Outpatient Medications on File Prior to Visit  Medication Sig Dispense Refill  . acetaminophen (TYLENOL) 500 MG tablet Take 500 mg by mouth every 6 (six) hours as needed for headache.    . levETIRAcetam (KEPPRA) 500 MG tablet Take 2 tablets twice a day 120 tablet 0   No current facility-administered medications on  file prior to visit.    ALLERGIES: Allergies  Allergen Reactions  . Amoxicillin     hives  . Penicillins     hives    FAMILY HISTORY: No family history on file.  SOCIAL HISTORY: Social History   Socioeconomic History  . Marital status: Married    Spouse  name: Not on file  . Number of children: 1  . Years of education: Not on file  . Highest education level: Not on file  Occupational History  . Occupation: Housekeeper  Tobacco Use  . Smoking status: Current Every Day Smoker    Types: Cigarettes  . Smokeless tobacco: Never Used  Substance and Sexual Activity  . Alcohol use: Yes    Alcohol/week: 0.0 standard drinks  . Drug use: No  . Sexual activity: Not on file  Other Topics Concern  . Not on file  Social History Narrative   Right handed   HS   Lives with husband and daughter in one story home   Social Determinants of Health   Financial Resource Strain:   . Difficulty of Paying Living Expenses: Not on file  Food Insecurity:   . Worried About Programme researcher, broadcasting/film/video in the Last Year: Not on file  . Ran Out of Food in the Last Year: Not on file  Transportation Needs:   . Lack of Transportation (Medical): Not on file  . Lack of Transportation (Non-Medical): Not on file  Physical Activity:   . Days of Exercise per Week: Not on file  . Minutes of Exercise per Session: Not on file  Stress:   . Feeling of Stress : Not on file  Social Connections:   . Frequency of Communication with Friends and Family: Not on file  . Frequency of Social Gatherings with Friends and Family: Not on file  . Attends Religious Services: Not on file  . Active Member of Clubs or Organizations: Not on file  . Attends Banker Meetings: Not on file  . Marital Status: Not on file  Intimate Partner Violence:   . Fear of Current or Ex-Partner: Not on file  . Emotionally Abused: Not on file  . Physically Abused: Not on file  . Sexually Abused: Not on file     PHYSICAL EXAM: Vitals:   03/14/20 1500  BP: 100/67  Pulse: (!) 105  SpO2: 97%   General: No acute distress Head:  Normocephalic/atraumatic Skin/Extremities: No rash, no edema Neurological Exam: alert and oriented to person, place, and time. No aphasia or dysarthria. Fund of  knowledge is appropriate.  Recent and remote memory are intact.  Attention and concentration are normal.   Cranial nerves: Pupils equal, round. Extraocular movements intact with no nystagmus. Visual fields full.  No facial asymmetry. Hard of hearing. Motor: Bulk and tone normal, muscle strength 5/5 throughout with no pronator drift.   Finger to nose testing intact.  Gait narrow-based and steady, no ataxia.   IMPRESSION: This is a pleasant 49 yo RH woman with a history of panic attacks/anxiety, as well as episodes of staring/unresponsiveness, who had several GTCs preceded by staring/unresponsiveness and lip smacking, suggestive of temporal lobe epilepsy. 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 EEG normal. No convulsions since 2018, she has not had panic attacks since April. Continue Levetiracetam 1000mg  BID. We discussed hearing loss, she reports significant tinnitus. Advised ENT follow-up.She does not drive. Follow-up in 8 months, they know to call for any changes.   Thank you for allowing me to  participate in her care.  Please do not hesitate to call for any questions or concerns.   Patrcia Dolly, M.D.   CC: [facility]

## 2020-03-14 NOTE — Patient Instructions (Signed)
Good to see you! Continue Keppra 500mg : take 2 tablets twice a day. Continue calendar of your symptoms. Follow-up in 8 months, call for any changes   Seizure Precautions: 1. If medication has been prescribed for you to prevent seizures, take it exactly as directed.  Do not stop taking the medicine without talking to your doctor first, even if you have not had a seizure in a long time.   2. Avoid activities in which a seizure would cause danger to yourself or to others.  Don't operate dangerous machinery, swim alone, or climb in high or dangerous places, such as on ladders, roofs, or girders.  Do not drive unless your doctor says you may.  3. If you have any warning that you may have a seizure, lay down in a safe place where you can't hurt yourself.    4.  No driving for 6 months from last seizure, as per [facility].   Please refer to the following link on the Epilepsy Foundation of America's website for more information: http://www.epilepsyfoundation.org/answerplace/Social/driving/drivingu.cfm   5.  Maintain good sleep hygiene. Avoid alcohol.  6.  Notify your neurology if you are planning pregnancy or if you become pregnant.  7.  Contact your doctor if you have any problems that may be related to the medicine you are taking.  8.  Call 911 and bring the patient back to the ED if:        A.  The seizure lasts longer than 5 minutes.       B.  The patient doesn't awaken shortly after the seizure  C.  The patient has new problems such as difficulty seeing, speaking or moving  D.  The patient was injured during the seizure  E.  The patient has a temperature over 102 F (39C)  F.  The patient vomited and now is having trouble breathing

## 2020-03-16 ENCOUNTER — Other Ambulatory Visit: Payer: Self-pay

## 2020-03-16 ENCOUNTER — Ambulatory Visit: Payer: Self-pay | Attending: Nurse Practitioner | Admitting: Nurse Practitioner

## 2020-04-09 MED FILL — levETIRAcetam 500 MG TABS: 500 | 30 days supply | Qty: 120 | Fill #1

## 2020-05-09 MED FILL — levETIRAcetam 500 MG TABS: 500 | 30 days supply | Qty: 120 | Fill #2

## 2020-05-15 ENCOUNTER — Telehealth: Payer: Self-pay | Admitting: Neurology

## 2020-05-15 DIAGNOSIS — G40209 Localization-related (focal) (partial) symptomatic epilepsy and epileptic syndromes with complex partial seizures, not intractable, without status epilepticus: Secondary | ICD-10-CM

## 2020-05-15 MED ORDER — LEVETIRACETAM 500 MG PO TABS: ORAL_TABLET | ORAL | 0 refills | Status: DC

## 2020-05-15 NOTE — Telephone Encounter (Signed)
Called patient and LVM to inform her that requested rx was sent to [facility] on [address] and to call back if any questions or concerns.

## 2020-05-15 NOTE — Telephone Encounter (Signed)
Ok to send, thanks

## 2020-05-15 NOTE — Telephone Encounter (Signed)
Patient is requesting Keppra 500mg  to be sent to the pharmacy below for 6 days worth to last until she can get to her regular pharmacy (due to the inclement weather).   Patient is out of the medication and said she cannot get it from her regular pharmacy at this time. Her spouse works at the store below and she is requesting a partial prescription for now.  [facility] in [place]

## 2020-05-15 NOTE — Addendum Note (Signed)
Addended by: Karl Luke A on: 05/15/2020 04:47 PM   Modules accepted: Orders

## 2020-05-16 ENCOUNTER — Telehealth: Payer: Self-pay | Admitting: Neurology

## 2020-06-15 ENCOUNTER — Telehealth: Payer: Self-pay | Admitting: Neurology

## 2020-06-15 ENCOUNTER — Other Ambulatory Visit: Payer: Self-pay

## 2020-06-15 ENCOUNTER — Other Ambulatory Visit: Payer: Self-pay | Admitting: Neurology

## 2020-06-15 DIAGNOSIS — G40209 Localization-related (focal) (partial) symptomatic epilepsy and epileptic syndromes with complex partial seizures, not intractable, without status epilepticus: Secondary | ICD-10-CM

## 2020-06-15 MED ORDER — LEVETIRACETAM 500 MG PO TABS: ORAL_TABLET | ORAL | 2 refills | Status: DC

## 2020-06-15 MED FILL — levETIRAcetam 500 MG TABS: 500 | 30 days supply | Qty: 120 | Fill #0

## 2020-06-15 NOTE — Telephone Encounter (Signed)
Refill called in. 

## 2020-06-15 NOTE — Telephone Encounter (Signed)
Patient called and requested refills on her new medication, Keppra 500 mg. She reports she is doing well on the medicine and wants to continue taking it.  [facility]

## 2020-07-16 MED FILL — levETIRAcetam 500 MG TABS: 500 | 30 days supply | Qty: 120 | Fill #1

## 2020-07-28 ENCOUNTER — Other Ambulatory Visit: Payer: Self-pay

## 2020-08-20 ENCOUNTER — Other Ambulatory Visit: Payer: Self-pay

## 2020-08-20 MED FILL — Levetiracetam Tab 500 MG: ORAL | 30 days supply | Qty: 120 | Fill #0 | Status: AC

## 2020-08-22 ENCOUNTER — Other Ambulatory Visit: Payer: Self-pay

## 2020-09-17 ENCOUNTER — Other Ambulatory Visit: Payer: Self-pay | Admitting: Neurology

## 2020-09-17 DIAGNOSIS — G40209 Localization-related (focal) (partial) symptomatic epilepsy and epileptic syndromes with complex partial seizures, not intractable, without status epilepticus: Secondary | ICD-10-CM

## 2020-09-18 ENCOUNTER — Other Ambulatory Visit: Payer: Self-pay

## 2020-09-18 MED ORDER — LEVETIRACETAM 500 MG PO TABS
ORAL_TABLET | Freq: Two times a day (BID) | ORAL | 0 refills | Status: DC
  Filled 2020-09-18: qty 120, 30d supply, fill #0

## 2020-09-19 ENCOUNTER — Other Ambulatory Visit: Payer: Self-pay

## 2020-10-18 ENCOUNTER — Other Ambulatory Visit: Payer: Self-pay

## 2020-10-18 ENCOUNTER — Other Ambulatory Visit: Payer: Self-pay | Admitting: Neurology

## 2020-10-18 DIAGNOSIS — G40209 Localization-related (focal) (partial) symptomatic epilepsy and epileptic syndromes with complex partial seizures, not intractable, without status epilepticus: Secondary | ICD-10-CM

## 2020-10-18 MED ORDER — LEVETIRACETAM 500 MG PO TABS
ORAL_TABLET | Freq: Two times a day (BID) | ORAL | 0 refills | Status: DC
  Filled 2020-10-18: qty 120, 30d supply, fill #0

## 2020-10-22 ENCOUNTER — Other Ambulatory Visit: Payer: Self-pay

## 2020-11-19 ENCOUNTER — Other Ambulatory Visit: Payer: Self-pay

## 2020-11-19 ENCOUNTER — Other Ambulatory Visit: Payer: Self-pay | Admitting: Neurology

## 2020-11-19 DIAGNOSIS — G40209 Localization-related (focal) (partial) symptomatic epilepsy and epileptic syndromes with complex partial seizures, not intractable, without status epilepticus: Secondary | ICD-10-CM

## 2020-11-19 MED ORDER — LEVETIRACETAM 500 MG PO TABS
ORAL_TABLET | Freq: Two times a day (BID) | ORAL | 0 refills | Status: DC
  Filled 2020-11-19: qty 120, 30d supply, fill #0

## 2020-11-21 ENCOUNTER — Other Ambulatory Visit: Payer: Self-pay

## 2020-11-30 ENCOUNTER — Ambulatory Visit (INDEPENDENT_AMBULATORY_CARE_PROVIDER_SITE_OTHER): Payer: Self-pay | Admitting: Neurology

## 2020-11-30 ENCOUNTER — Encounter: Payer: Self-pay | Admitting: Neurology

## 2020-11-30 ENCOUNTER — Other Ambulatory Visit: Payer: Self-pay

## 2020-11-30 DIAGNOSIS — G40209 Localization-related (focal) (partial) symptomatic epilepsy and epileptic syndromes with complex partial seizures, not intractable, without status epilepticus: Secondary | ICD-10-CM

## 2020-11-30 MED ORDER — LEVETIRACETAM 500 MG PO TABS
ORAL_TABLET | Freq: Two times a day (BID) | ORAL | 11 refills | Status: DC
  Filled 2020-11-30: qty 120, fill #0
  Filled 2020-12-17: qty 120, 30d supply, fill #0
  Filled 2021-01-21: qty 120, 30d supply, fill #1
  Filled 2021-02-18: qty 120, 30d supply, fill #2
  Filled 2021-03-20: qty 120, 30d supply, fill #3
  Filled 2021-04-22: qty 120, 30d supply, fill #4
  Filled 2021-05-23: qty 120, 30d supply, fill #0
  Filled 2021-05-23: qty 120, 30d supply, fill #5
  Filled 2021-06-24: qty 120, 30d supply, fill #1
  Filled 2021-07-22: qty 120, 30d supply, fill #2
  Filled 2021-08-22: qty 120, 30d supply, fill #3
  Filled 2021-09-16: qty 120, 30d supply, fill #4
  Filled 2021-10-21: qty 120, 30d supply, fill #5
  Filled 2021-11-20: qty 120, 30d supply, fill #6

## 2020-11-30 NOTE — Progress Notes (Signed)
NEUROLOGY FOLLOW UP OFFICE NOTE  Tracy Kelly [id number] [birth year]  HISTORY OF PRESENT ILLNESS: I had the pleasure of seeing Tracy Kelly in follow-up in the neurology clinic on 11/30/2020.  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 She is again accompanied by her husband who helps supplement the history today.  Records and images were personally reviewed where available.  She denies any convulsions since 03/2017. She was also having recurrent panic attacks/anxiety that improved with initiation of Levetiracetam, none since April 2021. She is on Levetiracetam 1000mg  BID without side effects. They deny any staring/unresponsive episodes, gaps in time, olfactory/gustatory hallucinations, focal numbness/tingling/weakness, myoclonic jerks. She has headaches here and there that they attribute to sinus pressure. She has had congestion and has difficulty hearing out of the left ear. No dizziness, no falls. Sleep is off and mood. Mood is okay.  History on Initial Assessment 03/16/2015: This is a pleasant 50 yo RH woman with new onset seizure last 12/25/14. She has no recollection of events, she recalls going to work that morning, then waking up in the hospital. She and her husband work together, and he reports that on the morning of 12/25/14, prior to going to work, he noticed her standing at the door, staring and unresponsive for around 5 seconds. They went to work where she had a panic attack and another staring episode that she is amnestic of. They got home then later on he found her sitting on the couch unresponsive, then just saying "okay" when asked questions. She then stopped talking and started having lip smacking. He called 911, and when they arrived, she continued to be staring then proceeded to have a generalized tonic-clonic seizure was head turn to the right lasting 1-2 minutes. She had 3 more seizures en route and was given Versed. She was back to  baseline in the ER and was started on Keppra. Bloodwork showed normal CBC except mild thrombocytopenia with platelets of 124, CMP unremarkable, UDS positive for benzodiazepines (received Versed in ambulance). 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 Routine wake and sleep EEG was normal.   Her husband reports that she has always had panic attacks since her 42s. He has also been noticing staring and unresponsive episodes for the past couple of months. She had tried Paxil for anxiety in her 70s, but felt unwell and woke up in the hospital. There have been no further seizures, staring episodes, as well as no further panic attacks/anxiety since starting Keppra 500mg  BID. 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   Epilepsy Risk Factors:  She had a normal birth and early development.  There is no history of febrile convulsions, CNS infections such as meningitis/encephalitis, significant traumatic brain injury, neurosurgical procedures, or family history of seizures.  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  Routine wake and sleep EEG 11/2014 was normal.    PAST MEDICAL HISTORY: Past Medical History:  Diagnosis Date   Anxiety    Seizures (HCC)     MEDICATIONS: Current Outpatient Medications on File Prior to Visit  Medication Sig Dispense Refill   acetaminophen (TYLENOL) 500 MG tablet Take 500 mg by mouth every 6 (six) hours  as needed for headache.     levETIRAcetam (KEPPRA) 500 MG tablet TAKE 2 TABLETS TWICE A DAY 120 tablet 0   No current facility-administered medications on file prior to visit.    ALLERGIES: Allergies  Allergen Reactions   Amoxicillin     hives    Penicillins     hives    FAMILY HISTORY: History reviewed. No pertinent family history.  SOCIAL HISTORY: Social History   Socioeconomic History   Marital status: Married    Spouse name: Not on file   Number of children: 1   Years of education: Not on file   Highest education level: Not on file  Occupational History   Occupation: Housekeeper  Tobacco Use   Smoking status: Every Day    Types: Cigarettes   Smokeless tobacco: Never  Vaping Use   Vaping Use: Never used  Substance and Sexual Activity   Alcohol use: Not Currently    Alcohol/week: 0.0 standard drinks   Drug use: No   Sexual activity: Not on file  Other Topics Concern   Not on file  Social History Narrative   Right handed   HS   Lives with husband and daughter in one story home   Social Determinants of Health   Financial Resource Strain: Not on file  Food Insecurity: Not on file  Transportation Needs: Not on file  Physical Activity: Not on file  Stress: Not on file  Social Connections: Not on file  Intimate Partner Violence: Not on file     PHYSICAL EXAM: Vitals:   11/30/20 0830  BP: 106/76  Pulse: 84  SpO2: (!) 8%   General: No acute distress Head:  Normocephalic/atraumatic Skin/Extremities: No rash, no edema Neurological Exam: alert and awake. No aphasia or dysarthria. Fund of knowledge is appropriate.  Recent and remote memory are intact.  Attention and concentration are normal.   Cranial nerves: Pupils equal, round. Extraocular movements intact with no nystagmus. Visual fields full.  No facial asymmetry. Hard of hearing. Motor: Bulk and tone normal, muscle strength 5/5 throughout with no pronator drift.   Finger to nose testing intact.  Gait slightly wide-based, no ataxia   IMPRESSION: This is a pleasant 50 yo RH woman with a history of panic attacks/anxiety, as well as episodes of staring/unresponsiveness, who had several GTCs preceded by staring/unresponsiveness and lip smacking,  suggestive of temporal lobe epilepsy. 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 EEG normal. She has been doing well with no convulsions since 2018, no panic attacks since April 2021. Continue Levetiracetam 1000mg  BID, she is doing better with compliance using a pillbox. She does not drive. Advised ENT evaluation for hearing loss. Follow-up in 1 year, call for any changes.    Thank you for allowing me to participate in her care.  Please do not hesitate to call for any questions or concerns.    , M.D.   CC: [facility]

## 2020-11-30 NOTE — Patient Instructions (Signed)
Good to see you!   Continue Keppra 500mg : take 2 tablets twice a day  2. Recommend seeing ENT for your hearing loss  3. Follow-up in in 1 year, call for any changes   Seizure Precautions: 1. If medication has been prescribed for you to prevent seizures, take it exactly as directed.  Do not stop taking the medicine without talking to your doctor first, even if you have not had a seizure in a long time.   2. Avoid activities in which a seizure would cause danger to yourself or to others.  Don't operate dangerous machinery, swim alone, or climb in high or dangerous places, such as on ladders, roofs, or girders.  Do not drive unless your doctor says you may.  3. If you have any warning that you may have a seizure, lay down in a safe place where you can't hurt yourself.    4.  No driving for 6 months from last seizure, as per [facility].   Please refer to the following link on the Epile[employer] of [employer]'s website for more information: http://www.epilepsyfoundation.org/answerplace/Social/driving/drivingu.cfm   5.  Maintain good sleep hygiene. Avoid alcohol.  6.  Notify your neurology if you are planning pregnancy or if you become pregnant.  7.  Contact your doctor if you have any problems that may be related to the medicine you are taking.  8.  Call 911 and bring the patient back to the ED if:        A.  The seizure lasts longer than 5 minutes.       B.  The patient doesn't awaken shortly after the seizure  C.  The patient has new problems such as difficulty seeing, speaking or moving  D.  The patient was injured during the seizure  E.  The patient has a temperature over 102 F (39C)  F.  The patient vomited and now is having trouble breathing

## 2020-12-17 ENCOUNTER — Other Ambulatory Visit: Payer: Self-pay

## 2020-12-19 ENCOUNTER — Other Ambulatory Visit: Payer: Self-pay

## 2021-01-21 ENCOUNTER — Other Ambulatory Visit: Payer: Self-pay

## 2021-01-23 ENCOUNTER — Other Ambulatory Visit: Payer: Self-pay

## 2021-02-18 ENCOUNTER — Other Ambulatory Visit: Payer: Self-pay

## 2021-02-20 ENCOUNTER — Other Ambulatory Visit: Payer: Self-pay

## 2021-03-20 ENCOUNTER — Other Ambulatory Visit: Payer: Self-pay

## 2021-03-25 ENCOUNTER — Ambulatory Visit: Payer: Self-pay | Admitting: Podiatry

## 2021-03-25 ENCOUNTER — Other Ambulatory Visit: Payer: Self-pay

## 2021-04-23 ENCOUNTER — Other Ambulatory Visit: Payer: Self-pay

## 2021-04-24 ENCOUNTER — Other Ambulatory Visit: Payer: Self-pay

## 2021-05-23 ENCOUNTER — Other Ambulatory Visit: Payer: Self-pay

## 2021-05-27 ENCOUNTER — Other Ambulatory Visit: Payer: Self-pay

## 2021-06-14 ENCOUNTER — Ambulatory Visit: Payer: Self-pay | Admitting: *Deleted

## 2021-06-14 NOTE — Telephone Encounter (Signed)
° ° ° °  Chief Complaint: "Panic attack" Symptoms: "Anxious last night and this morning, not bad now" No triggers Frequency: Onset 1 year ago "Off and on" Pertinent Negatives: Patient denies Suicidal ideation, no physical symptoms.  Disposition: [] ED /[x] Urgent Care (no appt availability in office) / [] Appointment(In office/virtual)/ []  [facility]/ [] Home Care/ [] Refused Recommended Disposition /[] [facility]/ []  Follow-up with PCP Additional Notes: Pt has apt next week with therapist. Sees neurologist regularly. Advised to alert neurologist. Resource numbers provided to and [facility]. Declined Number. Advised to CB if needed. Advised any physical, palpitations, go to ED. Pt does not sound to be in any distress presently. Reason for Disposition  [1] Symptoms of anxiety or panic attack AND [2] is a chronic symptom (recurrent or ongoing AND present > 4 weeks)  Answer Assessment - Initial Assessment Questions 1. CONCERN: "Did anything happen that prompted you to call today?"      Nothing 2. ANXIETY SYMPTOMS: "Can you describe how you (your loved one; patient) have been feeling?" (e.g., tense, restless, panicky, anxious, keyed up, overwhelmed, sense of impending doom).      Anxious, overwhelmed 3. ONSET: "How long have you been feeling this way?" (e.g., hours, days, weeks)     Off and on for a year 4. SEVERITY: "How would you rate the level of anxiety?" (e.g., 0 - 10; or mild, moderate, severe).     *No Answer* 5. FUNCTIONAL IMPAIRMENT: "How have these feelings affected your ability to do daily activities?" "Have you had more difficulty than usual doing your normal daily activities?" (e.g., getting better, same, worse; self-care, school, work, interactions)     No once panic wears off 6. HISTORY: "Have you felt this way before?" "Have you ever been diagnosed with an anxiety problem in the past?" (e.g., generalized anxiety disorder, panic  attacks, PTSD). If Yes, ask: "How was this problem treated?" (e.g., medicines, counseling, etc.)     Yes in past.  7. RISK OF HARM - SUICIDAL IDEATION: "Do you ever have thoughts of hurting or killing yourself?" If Yes, ask:  "Do you have these feelings now?" "Do you have a plan on how you would do this?"     No 8. TREATMENT:  "What has been done so far to treat this anxiety?" (e.g., medicines, relaxation strategies). "What has helped?"     "Keppra" 9. TREATMENT - THERAPIST: "Do you have a counselor or therapist? Name?"     Therapist, see her 06/19/21 10. POTENTIAL TRIGGERS: "Do you drink caffeinated beverages (e.g., coffee, colas, teas), and how much daily?" "Do you drink alcohol or use any drugs?" "Have you started any new medicines recently?"     no 10. PATIENT SUPPORT: "Who is with you now?" "Who do you live with?" "Do you have family or friends who you can talk to?"        Mom 11. OTHER SYMPTOMS: "Do you have any other symptoms?" (e.g., feeling depressed, trouble concentrating, trouble sleeping, trouble breathing, palpitations or fast heartbeat, chest pain, sweating, nausea, or diarrhea)       PAlpitations at times, not presently  Protocols used: Anxiety and Panic Attack-A-AH

## 2021-06-24 ENCOUNTER — Other Ambulatory Visit: Payer: Self-pay

## 2021-06-26 ENCOUNTER — Other Ambulatory Visit: Payer: Self-pay

## 2021-07-22 ENCOUNTER — Other Ambulatory Visit: Payer: Self-pay

## 2021-07-24 ENCOUNTER — Other Ambulatory Visit: Payer: Self-pay

## 2021-08-22 ENCOUNTER — Other Ambulatory Visit: Payer: Self-pay

## 2021-08-26 ENCOUNTER — Other Ambulatory Visit: Payer: Self-pay

## 2021-09-16 ENCOUNTER — Other Ambulatory Visit: Payer: Self-pay

## 2021-09-18 ENCOUNTER — Other Ambulatory Visit: Payer: Self-pay

## 2021-10-21 ENCOUNTER — Other Ambulatory Visit: Payer: Self-pay

## 2021-10-23 ENCOUNTER — Other Ambulatory Visit: Payer: Self-pay

## 2021-11-20 ENCOUNTER — Other Ambulatory Visit: Payer: Self-pay

## 2021-11-25 ENCOUNTER — Other Ambulatory Visit: Payer: Self-pay

## 2021-12-02 ENCOUNTER — Encounter: Payer: Self-pay | Admitting: Neurology

## 2021-12-02 ENCOUNTER — Other Ambulatory Visit: Payer: Self-pay

## 2021-12-02 ENCOUNTER — Ambulatory Visit (INDEPENDENT_AMBULATORY_CARE_PROVIDER_SITE_OTHER): Payer: Self-pay | Admitting: Neurology

## 2021-12-02 VITALS — BP 114/75 | HR 86 | Ht 66.0 in | Wt 139.0 lb

## 2021-12-02 DIAGNOSIS — G40209 Localization-related (focal) (partial) symptomatic epilepsy and epileptic syndromes with complex partial seizures, not intractable, without status epilepticus: Secondary | ICD-10-CM

## 2021-12-02 MED ORDER — LEVETIRACETAM 500 MG PO TABS
ORAL_TABLET | ORAL | 11 refills | Status: DC
  Filled 2021-12-02: qty 150, fill #0
  Filled 2021-12-16: qty 150, 30d supply, fill #0
  Filled 2022-01-16 (×2): qty 150, 30d supply, fill #1
  Filled 2022-02-19: qty 150, 30d supply, fill #2
  Filled 2022-03-24: qty 150, 30d supply, fill #3
  Filled 2022-04-24 – 2022-04-25 (×2): qty 150, 30d supply, fill #4

## 2021-12-02 NOTE — Patient Instructions (Signed)
Good to see you.  Increase Keppra (Levetiracetam) 500mg : take 2 tablets in AM, 3 tablets in PM  2. Call your PCP and let them know about the forgetfulness, sometimes if there is anemia, low B12, or thyroid issues, memory changes can occur  3. Continue calendar of symptoms, follow-up in 6 months, call for any changes   Seizure Precautions: 1. If medication has been prescribed for you to prevent seizures, take it exactly as directed.  Do not stop taking the medicine without talking to your doctor first, even if you have not had a seizure in a long time.   2. Avoid activities in which a seizure would cause danger to yourself or to others.  Don't operate dangerous machinery, swim alone, or climb in high or dangerous places, such as on ladders, roofs, or girders.  Do not drive unless your doctor says you may.  3. If you have any warning that you may have a seizure, lay down in a safe place where you can't hurt yourself.    4.  No driving for 6 months from last seizure, as per [facility] [place].   Please refer to the following link on the Epilepsy Foundation of [employer]'s website for more information: http://www.epilepsyfoundation.org/answerplace/Social/driving/drivingu.cfm   5.  Maintain good sleep hygiene. Avoid alcohol.  6.  Contact your doctor if you have any problems that may be related to the medicine you are taking.  7.  Call 911 and bring the patient back to the ED if:        A.  The seizure lasts longer than 5 minutes.       B.  The patient doesn't awaken shortly after the seizure  C.  The patient has new problems such as difficulty seeing, speaking or moving  D.  The patient was injured during the seizure  E.  The patient has a temperature over 102 F (39C)  F.  The patient vomited and now is having trouble breathing

## 2021-12-02 NOTE — Progress Notes (Signed)
NEUROLOGY FOLLOW UP OFFICE NOTE  Tracy Kelly [id number] 01-29-1971  HISTORY OF PRESENT ILLNESS: I had the pleasure of seeing Tracy Kelly in follow-up in the neurology clinic on 12/02/2021.  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 She is again accompanied by her husband who helps supplement the history today.  Since her last visit, they continue to deny any convulsions since 2018. In the past, she was having recurrent panic attacks/anxiety that improved with initiation of Levetiracetam 1000mg  BID. They report that these have recurred, she has them around once a week, usually 5 minutes after going to bed. She tries to sleep and starts feeling strange and hot, her heart is racing and there is a weird taste in her mouth. She feels she cannot talk. This lasts 5-10 minutes but sometimes she has them all night long, then can last all day the next day. Her husband has not seen much of the daytime episodes. He notes that they tend to occur when she is around unfamiliar people/surroundings, she is fine when things are familiar. She used to take Paxil but had side effects. She sees a therapist regularly. He denies any staring/unresponsive episodes. She denies any focal numbness/tingling/weakness, no myoclonic jerks. She has occasional headaches, no dizziness. She is hard of hearing. Her husband notes she is forgetful. Although this is not new, it is getting worse. She has not been sleeping lately due to her new job at , she has been there for a couple of weeks working 2pm-11pm, making her feel tired and sore on her feet. She does not drive.    History on Initial Assessment 03/16/2015: This is a pleasant 51 yo RH woman with new onset seizure last 12/25/14. She has no recollection of events, she recalls going to work that morning, then waking up in the hospital. She and her husband work together, and he reports that on the morning of 12/25/14, prior to going  to work, he noticed her standing at the door, staring and unresponsive for around 5 seconds. They went to work where she had a panic attack and another staring episode that she is amnestic of. They got home then later on he found her sitting on the couch unresponsive, then just saying "okay" when asked questions. She then stopped talking and started having lip smacking. He called 911, and when they arrived, she continued to be staring then proceeded to have a generalized tonic-clonic seizure was head turn to the right lasting 1-2 minutes. She had 3 more seizures en route and was given Versed. She was back to baseline in the ER and was started on Keppra. Bloodwork showed normal CBC except mild thrombocytopenia with platelets of 124, CMP unremarkable, UDS positive for benzodiazepines (received Versed in ambulance). 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 Routine wake and sleep EEG was normal.   Her husband reports that she has always had panic attacks since her 39s. He has also been noticing staring and unresponsive episodes for the past couple of months. She had tried Paxil for anxiety in her 20s, but felt unwell and woke up in the hospital. There have been no further seizures, staring episodes, as well as no further panic attacks/anxiety since starting Keppra 500mg  BID. 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   Epilepsy Risk  Factors:  She had a normal birth and early development.  There is no history of febrile convulsions, CNS infections such as meningitis/encephalitis, significant traumatic brain injury, neurosurgical procedures, or family history of seizures.  Diagnostic Data:  MRI brain with and without contrast 11/2015 was markedly oblique due to patient positioning, but appears that the left hippocampus is  slightly smaller than the right.  Routine wake and sleep EEG 11/2014 was normal.   PAST MEDICAL HISTORY: Past Medical History:  Diagnosis Date   Anxiety    Seizures (HCC)     MEDICATIONS: Current Outpatient Medications on File Prior to Visit  Medication Sig Dispense Refill   acetaminophen (TYLENOL) 500 MG tablet Take 500 mg by mouth every 6 (six) hours as needed for headache.     levETIRAcetam (KEPPRA) 500 MG tablet TAKE 2 TABLETS BY MOUTH TWICE A DAY 120 tablet 11   No current facility-administered medications on file prior to visit.    ALLERGIES: Allergies  Allergen Reactions   Amoxicillin     hives   Penicillins     hives    FAMILY HISTORY: History reviewed. No pertinent family history.  SOCIAL HISTORY: Social History   Socioeconomic History   Marital status: Married    Spouse name: Not on file   Number of children: 1   Years of education: Not on file   Highest education level: Not on file  Occupational History   Occupation: Housekeeper  Tobacco Use   Smoking status: Every Day    Types: Cigarettes   Smokeless tobacco: Never   Tobacco comments:    Not a 1/2 pack a day just less than  Vaping Use   Vaping Use: Never used  Substance and Sexual Activity   Alcohol use: Not Currently    Comment: rarely   Drug use: No   Sexual activity: Not on file  Other Topics Concern   Not on file  Social History Narrative   Right handed   HS   Lives with husband and daughter in one story home   Caffeine 3 cup a day   Social Determinants of Health   Financial Resource Strain: Not on file  Food Insecurity: Not on file  Transportation Needs: Not on file  Physical Activity: Not on file  Stress: Not on file  Social Connections: Not on file  Intimate Partner Violence: Not on file     PHYSICAL EXAM: Vitals:   12/02/21 0825  BP: 114/75  Pulse: 86  SpO2: 97%   General: No acute distress Head:  Normocephalic/atraumatic Skin/Extremities: No rash, no  edema Neurological Exam: alert and awake. No aphasia or dysarthria. Fund of knowledge is appropriate.  Attention and concentration are normal.   Cranial nerves: Pupils equal, round. Extraocular movements intact with no nystagmus. Visual fields full.  No facial asymmetry. Hard of hearing. Motor: Bulk and tone normal, muscle strength 5/5 throughout with no pronator drift.   Finger to nose testing intact.  Gait narrow-based and steady, able to tandem walk adequately.  Romberg negative.   IMPRESSION: This is a pleasant 51 yo RH woman with a history of panic attacks/anxiety, as well as episodes of staring/unresponsiveness, who had several GTCs preceded by staring/unresponsiveness and lip smacking, suggestive of temporal lobe epilepsy. 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 EEG normal. No convulsions since 2018. Previously, there was improvement in panic attacks with initiation of Levetiracetam, suggesting these are focal seizures, however she reports a recurrence of panic attacks, unclear if seizure-related or due  to stress. We discussed an ambulatory would be helpful in the future for symptom characterization, however due to insurance issues, we will hold off for now. We agreed to do a therapeutic trial of increasing Levetiracetam 500mg : Take 2 tablets in AM, 3 tablets in PM and see if there is improvement again in the panic attacks/possible focal seizures. She will keep a calendar of her symptoms and follow-up in 6 months. She does not drive.    Thank you for allowing me to participate in her care.  Please do not hesitate to call for any questions or concerns.    , M.D.   CC: Patrcia Dolly, NP

## 2021-12-16 ENCOUNTER — Other Ambulatory Visit: Payer: Self-pay

## 2021-12-18 ENCOUNTER — Other Ambulatory Visit: Payer: Self-pay

## 2022-01-16 ENCOUNTER — Other Ambulatory Visit: Payer: Self-pay

## 2022-01-20 ENCOUNTER — Other Ambulatory Visit: Payer: Self-pay

## 2022-02-19 ENCOUNTER — Other Ambulatory Visit: Payer: Self-pay

## 2022-03-24 ENCOUNTER — Other Ambulatory Visit: Payer: Self-pay

## 2022-04-25 ENCOUNTER — Other Ambulatory Visit: Payer: Self-pay

## 2022-05-28 ENCOUNTER — Other Ambulatory Visit: Payer: Self-pay

## 2022-07-02 ENCOUNTER — Encounter: Payer: Self-pay | Admitting: Neurology

## 2022-07-02 ENCOUNTER — Ambulatory Visit (INDEPENDENT_AMBULATORY_CARE_PROVIDER_SITE_OTHER): Payer: BC Managed Care – PPO | Admitting: Neurology

## 2022-07-02 DIAGNOSIS — G40209 Localization-related (focal) (partial) symptomatic epilepsy and epileptic syndromes with complex partial seizures, not intractable, without status epilepticus: Secondary | ICD-10-CM | POA: Diagnosis not present

## 2022-07-02 MED ORDER — SERTRALINE HCL 25 MG PO TABS: 25.0000 mg | ORAL_TABLET | Freq: Every day | ORAL | 6 refills | Status: DC

## 2022-07-02 MED ORDER — LEVETIRACETAM 500 MG PO TABS: ORAL_TABLET | ORAL | 11 refills | Status: DC

## 2022-07-02 NOTE — Patient Instructions (Addendum)
Good to see you.  Start Sertraline '25mg'$ : Take 1 tablet daily  2. Continue Levetiracetam '500mg'$ : take 2 tablets every morning, 3 tablets in evening  3. Keep a calendar of symptoms as you start medication  4. Follow-up in 4-5 months, call for any changes   Seizure Precautions: 1. If medication has been prescribed for you to prevent seizures, take it exactly as directed.  Do not stop taking the medicine without talking to your doctor first, even if you have not had a seizure in a long time.   2. Avoid activities in which a seizure would cause danger to yourself or to others.  Don't operate dangerous machinery, swim alone, or climb in high or dangerous places, such as on ladders, roofs, or girders.  Do not drive unless your doctor says you may.  3. If you have any warning that you may have a seizure, lay down in a safe place where you can't hurt yourself.    4.  No driving for 6 months from last seizure, as per [facility].   Please refer to the following link on the [place] website for more information: http://www.epilepsyfoundation.org/answerplace/Social/driving/drivingu.cfm   5.  Maintain good sleep hygiene. Avoid alcohol.  6.  Contact your doctor if you have any problems that may be related to the medicine you are taking.  7.  Call 911 and bring the patient back to the ED if:        A.  The seizure lasts longer than 5 minutes.       B.  The patient doesn't awaken shortly after the seizure  C.  The patient has new problems such as difficulty seeing, speaking or moving  D.  The patient was injured during the seizure  E.  The patient has a temperature over 102 F (39C)  F.  The patient vomited and now is having trouble breathing
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
## 2022-07-22 ENCOUNTER — Telehealth: Payer: Self-pay | Admitting: Anesthesiology

## 2022-07-22 NOTE — Telephone Encounter (Signed)
Pt called to see what questions she has no answer unable to leave a voice mail

## 2022-07-22 NOTE — Telephone Encounter (Signed)
Pt called back in she stated she wanted to check with Dr. Delice Lesch to see if she can take Viviscal for her hair? The box said to consult with her doctor before she takes it.  She is on the [place] and has been noticing she is losing some of her hair. That is why she is wanting to take the [place]. She is wondering if it could be the [place] making her hair come out?

## 2022-07-22 NOTE — Telephone Encounter (Signed)
I don't think the Keppra is causing her hair loss. Viviscal looks like mostly vitamins, should be okay to take if she wants, but would recommend Dermatology to weigh in

## 2022-07-22 NOTE — Telephone Encounter (Signed)
Pt called an informed that she doesn't  think the Keppra is causing her hair loss. Viviscal looks like mostly vitamins, should be okay to take if she wants, but would recommend Dermatology to weigh in

## 2022-07-22 NOTE — Telephone Encounter (Signed)
Pt left message with AN stating she has a question for Dr Delice Lesch, about starting a new medication. Requests call back.

## 2022-12-08 ENCOUNTER — Other Ambulatory Visit: Payer: Self-pay

## 2022-12-08 ENCOUNTER — Encounter: Payer: Self-pay | Admitting: Neurology

## 2022-12-08 ENCOUNTER — Ambulatory Visit (INDEPENDENT_AMBULATORY_CARE_PROVIDER_SITE_OTHER): Payer: BC Managed Care – PPO | Admitting: Neurology

## 2022-12-08 VITALS — BP 94/61 | HR 79 | Ht 66.0 in | Wt 124.0 lb

## 2022-12-08 DIAGNOSIS — F419 Anxiety disorder, unspecified: Secondary | ICD-10-CM

## 2022-12-08 DIAGNOSIS — G40209 Localization-related (focal) (partial) symptomatic epilepsy and epileptic syndromes with complex partial seizures, not intractable, without status epilepticus: Secondary | ICD-10-CM | POA: Diagnosis not present

## 2022-12-08 MED ORDER — SERTRALINE HCL 25 MG PO TABS: 25.0000 mg | ORAL_TABLET | Freq: Every day | ORAL | 6 refills | Status: DC

## 2022-12-08 MED ORDER — LEVETIRACETAM 500 MG PO TABS
ORAL_TABLET | ORAL | 11 refills | Status: DC
Start: 2022-12-08 — End: 2023-09-02

## 2022-12-08 NOTE — Patient Instructions (Addendum)
Good to see you.  Schedule MRI brain with and without contrast  2. Schedule 1-hour EEG. If normal, we will plan for a 3-day home EEG  3. Continue all your medications  4. Recommend hearing evaluation   5. Follow-up in 2 months, call for any changes   Seizure Precautions: 1. If medication has been prescribed for you to prevent seizures, take it exactly as directed.  Do not stop taking the medicine without talking to your doctor first, even if you have not had a seizure in a long time.   2. Avoid activities in which a seizure would cause danger to yourself or to others.  Don't operate dangerous machinery, swim alone, or climb in high or dangerous places, such as on ladders, roofs, or girders.  Do not drive unless your doctor says you may.  3. If you have any warning that you may have a seizure, lay down in a safe place where you can't hurt yourself.    4.  No driving for 6 months from last seizure, as per [facility].   Please refer to the following link on the Epilepsy Foundation of America's website for more information: http://www.epilepsyfoundation.org/answerplace/Social/driving/drivingu.cfm   5.  Maintain good sleep hygiene. Avoid alcohol.  6.  Contact your doctor if you have any problems that may be related to the medicine you are taking.  7.  Call 911 and bring the patient back to the ED if:        A.  The seizure lasts longer than 5 minutes.       B.  The patient doesn't awaken shortly after the seizure  C.  The patient has new problems such as difficulty seeing, speaking or moving  D.  The patient was injured during the seizure  E.  The patient has a temperature over 102 F (39C)  F.  The patient vomited and now is having trouble breathing

## 2022-12-08 NOTE — Progress Notes (Signed)
NEUROLOGY FOLLOW UP OFFICE NOTE  Tracy Kelly [id number] [birth year]  HISTORY OF PRESENT ILLNESS: I had the pleasure of seeing Tracy Kelly in follow-up in the neurology clinic on 12/08/2022.  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 She is again accompanied by her husband who helps supplement the history today.  Records and images were personally reviewed where available.  On her last visit, she reported continued panic attacks with no improvement when Levetiracetam was increased to 1000mg  in AM, 1500mg  in PM. No convulsions since 2018. Sertraline 25mg  daily was added. Her husband also expressed concern privately about her cognitive status, which has been a chronic issue. Since her last visit, she contacted our office about concern for hair loss from Levetiracetam, advised to speak to PCP/Dermatology first.  No convulsions since 2018. Her husband has not witnessed any staring/unresponsive episodes. It appeared the Sertraline helped initially with the panic attacks, however recently they have increased in frequency occurring 1-2 times a month but lasting on and off the whole day. At work, they have had to call him 4-5 times because she wants him, when he arrives she is not confused but has a look on her face. He has to sit her down because she seems to be on the shaky side. She would be tired and achy after. Last Thursday she had it all day, he had to let her stay home the next day. She states she can talk and understand during them. Sometimes she notices a weird taste with them. No focal symptoms. Sometimes they wake her up from sleep, lasting 30 seconds at a time. If they are happening earlier in the afternoon, he tells her to take the Sertraline earlier in the day and it seems to help. She had a sinus infection one time and seemed to have them more frequently. Her husband also provides additional information that they seem to have increased also with  some family issues on her side lately. She denies any headaches, dizziness, vision changes, no significant falls. She had diarrhea recently when eating, it has calmed down the last couple of days with over the counter medication.    History on Initial Assessment 03/16/2015: This is a pleasant 52 yo RH woman with new onset seizure last 12/25/14. She has no recollection of events, she recalls going to work that morning, then waking up in the hospital. She and her husband work together, and he reports that on the morning of 12/25/14, prior to going to work, he noticed her standing at the door, staring and unresponsive for around 5 seconds. They went to work where she had a panic attack and another staring episode that she is amnestic of. They got home then later on he found her sitting on the couch unresponsive, then just saying "okay" when asked questions. She then stopped talking and started having lip smacking. He called 911, and when they arrived, she continued to be staring then proceeded to have a generalized tonic-clonic seizure was head turn to the right lasting 1-2 minutes. She had 3 more seizures en route and was given Versed. She was back to baseline in the ER and was started on Keppra. Bloodwork showed normal CBC except mild thrombocytopenia with platelets of 124, CMP unremarkable, UDS positive for benzodiazepines (received Versed in ambulance). I personally reviewed MRI brain with and without contrast which was markedly oblique due to patient positioning, but appears that the left hippocampus is slightly smaller than the  right. Routine wake and sleep EEG was normal.   Her husband reports that she has always had panic attacks since her 38s. He has also been noticing staring and unresponsive episodes for the past couple of months. She had tried Paxil for anxiety in her 9s, but felt unwell and woke up in the hospital. There have been no further seizures, staring episodes, as well as no further panic  attacks/anxiety since starting Keppra 500mg  BID. 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   Epilepsy Risk Factors:  She had a normal birth and early development.  There is no history of febrile convulsions, CNS infections such as meningitis/encephalitis, significant traumatic brain injury, neurosurgical procedures, or family history of seizures.  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  Routine wake and sleep EEG 11/2014 was normal.   PAST MEDICAL HISTORY: Past Medical History:  Diagnosis Date   Anxiety    Seizures ([facility])     MEDICATIONS: Current Outpatient Medications on File Prior to Visit  Medication Sig Dispense Refill   acetaminophen (TYLENOL) 500 MG tablet Take 500 mg by mouth every 6 (six) hours as needed for headache.     levETIRAcetam (KEPPRA) 500 MG tablet Take 2 tablets in morning, and 3 tablets in evening 150 tablet 11   sertraline (ZOLOFT) 25 MG tablet Take 1 tablet (25 mg total) by mouth daily. 30 tablet 6   No current facility-administered medications on file prior to visit.    ALLERGIES: Allergies  Allergen Reactions   Amoxicillin     hives   Penicillins     hives    FAMILY HISTORY: Family History  Problem Relation Age of Onset   Scleritis Mother     SOCIAL HISTORY: Social History   Socioeconomic History   Marital status: Married    Spouse name: Not on file   Number of children: 1   Years of education: Not on file   Highest education level: Not on file  Occupational History   Occupation: Housekeeper  Tobacco Use   Smoking status: Every Day    Types: Cigarettes   Smokeless tobacco: Never   Tobacco comments:    Not a 1/2 pack a day just less than  Vaping Use   Vaping status: Never Used  Substance and Sexual Activity   Alcohol use: Not Currently    Comment:  rarely   Drug use: No   Sexual activity: Not on file  Other Topics Concern   Not on file  Social History Narrative   Right handed   HS   Lives with husband and daughter in one story home   Caffeine 3 cup a day   Social Determinants of Health   Financial Resource Strain: Not on File (07/15/2021)   Received from [facility], [employer] [facility]    Financial Resource Strain: 0  Food Insecurity: Not on File (07/15/2021)   Received from [place], [place]   Food Insecurity    Food: 0  Transportation Needs: Not on File (07/15/2021)   Received from [facility], [facility] [facility] Needs    Transportation: 0  Physical Activity: Not on File (07/15/2021)   Received from [place], [place]   Physical Activity    Physical Activity: 0  Stress: Not on File (07/15/2021)   Received from [place], [place]   Stress  Stress: 0  Social Connections: Not on File (07/15/2021)   Received from [place], [place]   Social Connections    Social Connections and Isolation: 0  Intimate Partner Violence: Not on file     PHYSICAL EXAM: Vitals:   12/08/22 1424  BP: 94/61  Pulse: 79  SpO2: 98%   General: No acute distress Head:  Normocephalic/atraumatic Skin/Extremities: No rash, no edema Neurological Exam: alert and awake. No aphasia or dysarthria. Fund of knowledge is appropriate. Attention and concentration are normal.   Cranial nerves: Pupils equal, round. Extraocular movements intact with no nystagmus. Visual fields full.  No facial asymmetry. Hard of hearing.  Motor: Bulk and tone normal, muscle strength 5/5 throughout with no pronator drift.   Finger to nose testing intact.  Gait narrow-based and steady, able to tandem walk adequately.  Romberg negative.   IMPRESSION: This is a pleasant 52 yo RH woman with a history of panic attacks/anxiety, as well as episodes of staring/unresponsiveness, who had several GTCs preceded by staring/unresponsiveness and lip smacking, suggestive of temporal lobe epilepsy. 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 EEG normal. No convulsions since 2018 however she continues to report panic attacks, sometimes waking her up from sleep or associated with a weird taste. There was no improvement with increase in Levetiracetam or with addition of Sertraline. A 1-hour EEG will be ordered for characterization, if normal, we will plan for a 72-hour EEG. She will also be scheduled for brain MRI with and without contrast to assess for underlying structural abnormality. Continue Levetiracetam to 1000mg  in AM, 1500mg  in PM and Sertraline 25mg  daily for now. She does not drive. Advised hearing evaluation. Follow-up in 2 months, call for any changes.    Thank you for allowing me to participate in her care.  Please do not hesitate to call for any questions or concerns.    Patrcia Dolly, M.D.   CC: Claybon Jabs, NP

## 2022-12-10 ENCOUNTER — Ambulatory Visit (INDEPENDENT_AMBULATORY_CARE_PROVIDER_SITE_OTHER): Payer: BC Managed Care – PPO | Admitting: Neurology

## 2022-12-10 DIAGNOSIS — F419 Anxiety disorder, unspecified: Secondary | ICD-10-CM | POA: Diagnosis not present

## 2022-12-10 DIAGNOSIS — G40209 Localization-related (focal) (partial) symptomatic epilepsy and epileptic syndromes with complex partial seizures, not intractable, without status epilepticus: Secondary | ICD-10-CM | POA: Diagnosis not present

## 2022-12-10 NOTE — Progress Notes (Signed)
EEG complete - results pending 

## 2022-12-12 NOTE — Procedures (Signed)
ELECTROENCEPHALOGRAM REPORT  Date of Study: 12/10/2022  Patient's Name: Tracy Kelly MRN: [id number] Date of Birth: January 31, 1971  Referring Provider: Dr. Patrcia Dolly  Clinical History: This is a 52 year old woman with epilepsy with recurrent episodes of anxiety, EEG for classification.  Medications: Keppra, Sertraline  Technical Summary: A multichannel digital 1-hour EEG recording measured by the international 10-20 system with electrodes applied with paste and impedances below 5000 ohms performed in our laboratory with EKG monitoring in an awake and asleep patient.  Hyperventilation and photic stimulation were performed.  The digital EEG was referentially recorded, reformatted, and digitally filtered in a variety of bipolar and referential montages for optimal display.    Description: The patient is awake and asleep during the recording.  During maximal wakefulness, there is a symmetric, medium voltage 10 Hz posterior dominant rhythm that attenuates with eye opening.  The record is symmetric.  During drowsiness and sleep, there is an increase in theta slowing of the background.  Vertex waves and symmetric sleep spindles were seen. Hyperventilation and photic stimulation did not elicit any abnormalities.  There were no epileptiform discharges or electrographic seizures seen.    EKG lead was unremarkable.  Impression: This 1-hour awake and asleep EEG is normal.    Clinical Correlation: A normal EEG does not exclude a clinical diagnosis of epilepsy.  If further clinical questions remain, prolonged EEG may be helpful.  Clinical correlation is advised.   Patrcia Dolly, M.D.

## 2022-12-19 ENCOUNTER — Telehealth: Payer: Self-pay

## 2022-12-19 DIAGNOSIS — G40209 Localization-related (focal) (partial) symptomatic epilepsy and epileptic syndromes with complex partial seizures, not intractable, without status epilepticus: Secondary | ICD-10-CM

## 2022-12-19 NOTE — Telephone Encounter (Signed)
-----   Message from Van Clines sent at 12/12/2022  3:28 PM EDT ----- Pls let husband know the 1-hour EEG is normal, proceed with 3-day EEG as discussed. Also schedule MRI brain. Thanks

## 2022-12-19 NOTE — Telephone Encounter (Signed)
Pt called informed that  1-hour EEG is normal, proceed with 3-day EEG as discussed. Also schedule MRI brain, MRI is scheduled for October .

## 2023-01-12 ENCOUNTER — Ambulatory Visit (INDEPENDENT_AMBULATORY_CARE_PROVIDER_SITE_OTHER): Payer: BC Managed Care – PPO | Admitting: Neurology

## 2023-01-12 DIAGNOSIS — G40209 Localization-related (focal) (partial) symptomatic epilepsy and epileptic syndromes with complex partial seizures, not intractable, without status epilepticus: Secondary | ICD-10-CM | POA: Diagnosis not present

## 2023-01-12 NOTE — Progress Notes (Unsigned)
Ambulatory EEG hooked up and running. Light flashing. Push button tested. Camera and event log explained. Batteries explained. Patient understood.

## 2023-01-15 NOTE — Progress Notes (Signed)
AMB EEG discontinued. Pt had redness with unblanchable redness at fp1, f7, f8, f4.  Advised pt that skin should scab but if any issues pls call our team. I also advised neosporin could be placed on scab if needed.

## 2023-01-22 ENCOUNTER — Encounter: Payer: Self-pay | Admitting: Neurology

## 2023-01-27 NOTE — Procedures (Signed)
ELECTROENCEPHALOGRAM REPORT  Dates of Recording: 01/12/2023 4:27PM to 01/15/2023 11:59AM  Patient's Name: Tracy Kelly MRN: [id number] Date of Birth: July 05, 1970  Referring Provider: Dr. Patrcia Dolly  Procedure: 70-hour ambulatory video EEG  History: This is a 52 year old woman with epilepsy with recurrent episodes of anxiety, EEG for classification.   Medications: Keppra, Sertraline  Technical Summary: This is a 70-hour multichannel digital video EEG recording measured by the international 10-20 system with electrodes applied with paste and impedances below 5000 ohms performed as portable with EKG monitoring.  The digital EEG was referentially recorded, reformatted, and digitally filtered in a variety of bipolar and referential montages for optimal display.    DESCRIPTION OF RECORDING: During maximal wakefulness, the background activity consisted of a symmetric 10 Hz posterior dominant rhythm which was reactive to eye opening.  There were no epileptiform discharges or focal slowing seen in wakefulness.  During the recording, the patient progresses through wakefulness, drowsiness, and Stage 2 sleep.  Again, there were no epileptiform discharges seen.  Events: There were 47 push button events. Patient did not write any symptoms on diary. During times patient is on video, push buttons are accidental. Electrographically, there were no EEG or EKG changes seen.  There were no electrographic seizures seen.  EKG lead was unremarkable.  IMPRESSION: This 70-hour ambulatory video EEG study is normal.    CLINICAL CORRELATION: A normal EEG does not exclude a clinical diagnosis of epilepsy.  If further clinical questions remain, inpatient video EEG monitoring may be helpful.   Patrcia Dolly, M.D.

## 2023-01-28 ENCOUNTER — Ambulatory Visit
Admission: RE | Admit: 2023-01-28 | Discharge: 2023-01-28 | Disposition: A | Discharge status: Home / Self Care | Payer: BC Managed Care – PPO | Source: Ambulatory Visit | Attending: Neurology | Admitting: Neurology

## 2023-01-28 DIAGNOSIS — F419 Anxiety disorder, unspecified: Secondary | ICD-10-CM

## 2023-01-28 DIAGNOSIS — G40209 Localization-related (focal) (partial) symptomatic epilepsy and epileptic syndromes with complex partial seizures, not intractable, without status epilepticus: Secondary | ICD-10-CM

## 2023-01-28 MED ORDER — GADOPICLENOL 0.5 MMOL/ML IV SOLN
5.0000 mL | Freq: Once | INTRAVENOUS | Status: AC | PRN
  Administered 2023-01-28: 5 mL via INTRAVENOUS

## 2023-02-02 ENCOUNTER — Ambulatory Visit (INDEPENDENT_AMBULATORY_CARE_PROVIDER_SITE_OTHER): Payer: BC Managed Care – PPO | Admitting: Neurology

## 2023-02-02 ENCOUNTER — Encounter: Payer: Self-pay | Admitting: Neurology

## 2023-02-02 VITALS — BP 108/69 | HR 77 | Ht 66.0 in | Wt 123.6 lb

## 2023-02-02 DIAGNOSIS — F419 Anxiety disorder, unspecified: Secondary | ICD-10-CM

## 2023-02-02 DIAGNOSIS — G40209 Localization-related (focal) (partial) symptomatic epilepsy and epileptic syndromes with complex partial seizures, not intractable, without status epilepticus: Secondary | ICD-10-CM

## 2023-02-02 MED ORDER — SERTRALINE HCL 50 MG PO TABS: 50.0000 mg | ORAL_TABLET | Freq: Every day | ORAL | 11 refills | Status: DC

## 2023-02-02 MED ORDER — LORAZEPAM 1 MG PO TABS: ORAL_TABLET | ORAL | 5 refills | Status: DC

## 2023-02-02 NOTE — Patient Instructions (Signed)
Always good to see you.  Increase Zoloft (Sertraline) to 50mg : take 1 tablet every night  2. Continue Keppra (Levetiracetam) 500mg : take 2 tablets in AM, 3 tablets in PM  3. Take Ativan 1mg  as needed for the panic attacks. Do not take more than 3 a week  4. Keep a calendar of the panic attacks, follow-up in 3 months, call for any changes.    Seizure Precautions: 1. If medication has been prescribed for you to prevent seizures, take it exactly as directed.  Do not stop taking the medicine without talking to your doctor first, even if you have not had a seizure in a long time.   2. Avoid activities in which a seizure would cause danger to yourself or to others.  Don't operate dangerous machinery, swim alone, or climb in high or dangerous places, such as on ladders, roofs, or girders.  Do not drive unless your doctor says you may.  3. If you have any warning that you may have a seizure, lay down in a safe place where you can't hurt yourself.    4.  No driving for 6 months from last seizure, as per [facility].   Please refer to the following link on the Epilepsy Foundation of America's website for more information: http://www.epilepsyfoundation.org/answerplace/Social/driving/drivingu.cfm   5.  Maintain good sleep hygiene.  6.  Contact your doctor if you have any problems that may be related to the medicine you are taking.  7.  Call 911 and bring the patient back to the ED if:        A.  The seizure lasts longer than 5 minutes.       B.  The patient doesn't awaken shortly after the seizure  C.  The patient has new problems such as difficulty seeing, speaking or moving  D.  The patient was injured during the seizure  E.  The patient has a temperature over 102 F (39C)  F.  The patient vomited and now is having trouble breathing

## 2023-02-02 NOTE — Progress Notes (Signed)
NEUROLOGY FOLLOW UP OFFICE NOTE  Tracy Kelly [id number] 10-21-1970  HISTORY OF PRESENT ILLNESS: I had the pleasure of seeing Tracy Kelly in follow-up in the neurology clinic on 02/02/2023.  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 She is again accompanied by her husband who helps supplement the history today.  Records and images were personally reviewed where available. On her last visit, they continued to report panic attacks with no improvement with increase in Levetiracetam to 1000mg  in AM, 1500mg  in PM or addition of Sertraline 25mg  daily. Coworkers would have to call him at work due to panic attacks. She had a brain MRI with and without contrast in 01/2023 which was normal. Her 1-hour and 70-hour ambulatory EEG were normal. There were 47 push button events where she did not note any symptoms on event diary, no EEG changes during PB events.   She did not have any panic attacks during the EEG in 12/2022. She had one the Friday prior to EEG monitoring, none since then. They occur 1-2 times a month but last all day. No seizures or seizure-like symptoms, no convulsions since 2018. She denies any headaches, dizziness, vision changes, no falls.    History on Initial Assessment 03/16/2015: This is a pleasant 52 yo RH woman with new onset seizure last 12/25/14. She has no recollection of events, she recalls going to work that morning, then waking up in the hospital. She and her husband work together, and he reports that on the morning of 12/25/14, prior to going to work, he noticed her standing at the door, staring and unresponsive for around 5 seconds. They went to work where she had a panic attack and another staring episode that she is amnestic of. They got home then later on he found her sitting on the couch unresponsive, then just saying "okay" when asked questions. She then stopped talking and started having lip smacking. He called 911, and when  they arrived, she continued to be staring then proceeded to have a generalized tonic-clonic seizure was head turn to the right lasting 1-2 minutes. She had 3 more seizures en route and was given Versed. She was back to baseline in the ER and was started on Keppra. Bloodwork showed normal CBC except mild thrombocytopenia with platelets of 124, CMP unremarkable, UDS positive for benzodiazepines (received Versed in ambulance). 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 Routine wake and sleep EEG was normal.   Her husband reports that she has always had panic attacks since her 50s. He has also been noticing staring and unresponsive episodes for the past couple of months. She had tried Paxil for anxiety in her 50s, but felt unwell and woke up in the hospital. There have been no further seizures, staring episodes, as well as no further panic attacks/anxiety since starting Keppra 500mg  BID. 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   Epilepsy Risk Factors:  She had a normal birth and early development.  There is no history of febrile convulsions, CNS infections such as meningitis/encephalitis, significant traumatic brain injury, neurosurgical procedures, or family history of seizures.  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  Routine wake and sleep  EEG 11/2014 was normal.   PAST MEDICAL HISTORY: Past Medical History:  Diagnosis Date   Anxiety    Seizures (HCC)     MEDICATIONS: Current Outpatient Medications on File Prior to Visit  Medication Sig Dispense Refill   acetaminophen (TYLENOL) 500 MG tablet Take 500 mg by mouth every 6 (six) hours as needed for headache.      levETIRAcetam (KEPPRA) 500 MG tablet Take 2 tablets in morning, and 3 tablets in evening 150 tablet 11   sertraline (ZOLOFT) 25 MG tablet Take 1 tablet (25 mg total) by mouth daily. 30 tablet 6   No current facility-administered medications on file prior to visit.    ALLERGIES: Allergies  Allergen Reactions   Amoxicillin     hives   Penicillins     hives    FAMILY HISTORY: Family History  Problem Relation Age of Onset   Scleritis Mother     SOCIAL HISTORY: Social History   Socioeconomic History   Marital status: Married    Spouse name: Not on file   Number of children: 1   Years of education: Not on file   Highest education level: Not on file  Occupational History   Occupation: Housekeeper  Tobacco Use   Smoking status: Every Day    Types: Cigarettes   Smokeless tobacco: Never   Tobacco comments:    Not a 1/2 pack a day just less than  Vaping Use   Vaping status: Never Used  Substance and Sexual Activity   Alcohol use: Not Currently    Comment: rarely   Drug use: No   Sexual activity: Not on file  Other Topics Concern   Not on file  Social History Narrative   Right handed   HS   Lives with husband and daughter in one story home   Caffeine 3 cup a day   Social Determinants of Health   Financial Resource Strain: Not on File (07/15/2021)   Received from [facility], [facility]    Financial Resource Strain: 0  Food Insecurity: Not on File (01/22/2023)   Received from [facility] Insecurity    Food: 0  Transportation Needs: Not on File (07/15/2021)   Received from [facility], [facility] Needs    Transportation: 0  Physical Activity: Not on File (07/15/2021)   Received from [facility], [place]   Physical Activity    Physical Activity: 0  Stress: Not on File (07/15/2021)   Received from [facility], [place]   Stress    Stress: 0  Social Connections: Not on File (01/10/2023)   Received from [facility]   Social Connections    Connectedness: 0   Intimate Partner Violence: Not on file     PHYSICAL EXAM: Vitals:   02/02/23 1324  BP: 108/69  Pulse: 77  SpO2: 98%   General: No acute distress Head:  Normocephalic/atraumatic Skin/Extremities: No rash, no edema Neurological Exam: alert and awake. No aphasia or dysarthria. Fund of knowledge is appropriate.  Attention and concentration are normal.   Cranial nerves: Pupils equal, round. Extraocular movements intact.  No facial asymmetry.  Motor: moves all extremities symmetrically at least anti-gravity x 4. Gait narrow-based and steady, no ataxia. No tremors.    IMPRESSION: This is a pleasant 52 yo RH woman with a history of panic attacks/anxiety, as well as episodes of staring/unresponsiveness, who had several GTCs preceded by staring/unresponsiveness and lip smacking, suggestive of temporal lobe epilepsy. No convulsions since 2018 however they continue  to report panic attacks 1-2 times a month lasting all day. 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 Her 70-hour ambulatory EEG was normal, typical events were not captured. We discussed that although simple partial seizures are a possibility, with prolonged normal EEG, anxiety is more likely. Increase Sertraline to 50mg  daily. She was given a prescription for prn Lorazepam 1mg  to take as needed for panic attacks. Continue Levetiracetam 1000mg  in AM, 1500mg  in PM. Continue calendar of symptoms. She does not drive. Follow-up in 3 months, call for any changes.    Thank you for allowing me to participate in her care.  Please do not hesitate to call for any questions or concerns.    Patrcia Dolly, M.D.   CC: Claybon Jabs, NP

## 2023-05-18 ENCOUNTER — Ambulatory Visit (INDEPENDENT_AMBULATORY_CARE_PROVIDER_SITE_OTHER): Payer: BC Managed Care – PPO | Admitting: Neurology

## 2023-05-18 ENCOUNTER — Encounter: Payer: Self-pay | Admitting: Neurology

## 2023-05-18 VITALS — BP 111/77 | HR 73 | Ht 66.0 in | Wt 120.0 lb

## 2023-05-18 DIAGNOSIS — G40209 Localization-related (focal) (partial) symptomatic epilepsy and epileptic syndromes with complex partial seizures, not intractable, without status epilepticus: Secondary | ICD-10-CM

## 2023-05-18 DIAGNOSIS — F419 Anxiety disorder, unspecified: Secondary | ICD-10-CM

## 2023-05-18 MED ORDER — OXCARBAZEPINE 150 MG PO TABS: 150.0000 mg | ORAL_TABLET | Freq: Two times a day (BID) | ORAL | 6 refills | Status: DC

## 2023-05-18 NOTE — Progress Notes (Unsigned)
NEUROLOGY FOLLOW UP OFFICE NOTE  Tracy Kelly [id number] 12/06/1970  HISTORY OF PRESENT ILLNESS: I had the pleasure of seeing Tracy Kelly in follow-up in the neurology clinic on 05/18/2023.  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 She is again accompanied by her husband who helps supplement the history today.  Records and images were personally reviewed where available. On her last visit, we discussed normal 72-hour EEG done 12/2022. Sertraline was increased to 50mg  daily. She was also given prn lorazepam for panic attacks. She continues on Levetiracetam 1000mg  in AM, 1500mg  in PM for seizure prophylaxis without side effects.  Since her last visit, her husband reports an incident on 03/14/23 at work suggestive of seizure rather than panic attack. She was having panic attacks throughout the day and alerted a co-worker in [employer]. This is her last recollection. Her husband works in a Systems developer at [employer], he called her and she responded hello then there was no further response. He called her again and she kept repeating "hello" so he walked to her department and saw her just standing still with eyes fixed, no idea where she was. He had to take her arm in arm and lead her to the break room. He gave her an Ativan and within 15-20 minutes she started responding. She also reports a typical panic attack on 12/17 where she was fully responsive and took Ativan which helped. He reports last night she had a strange look in her eyes but she was responsive. She denies any olfactory/gustatory hallucinations, focal numbness/tingling/weakness, myoclonic jerks. No headaches, dizziness, vision changes, no falls. Her legs are sore. Sleep and mood are good.   Marland Kitchen  History on Initial Assessment 03/16/2015: This is a pleasant 53 yo RH woman with new onset seizure last 12/25/14. She has no recollection of events, she recalls going to work that morning, then waking up  in the hospital. She and her husband work together, and he reports that on the morning of 12/25/14, prior to going to work, he noticed her standing at the door, staring and unresponsive for around 5 seconds. They went to work where she had a panic attack and another staring episode that she is amnestic of. They got home then later on he found her sitting on the couch unresponsive, then just saying "okay" when asked questions. She then stopped talking and started having lip smacking. He called 911, and when they arrived, she continued to be staring then proceeded to have a generalized tonic-clonic seizure was head turn to the right lasting 1-2 minutes. She had 3 more seizures en route and was given Versed. She was back to baseline in the ER and was started on Keppra. Bloodwork showed normal CBC except mild thrombocytopenia with platelets of 124, CMP unremarkable, UDS positive for benzodiazepines (received Versed in ambulance). 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 Routine wake and sleep EEG was normal.   Her husband reports that she has always had panic attacks since her 26s. He has also been noticing staring and unresponsive episodes for the past couple of months. She had tried Paxil for anxiety in her 62s, but felt unwell and woke up in the hospital. There have been no further seizures, staring episodes, as well as no further panic attacks/anxiety since starting Keppra 500mg  BID. 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   Epilepsy Risk Factors:  She had a normal birth and early development.  There is no history of febrile convulsions, CNS infections such as meningitis/encephalitis, significant traumatic brain injury, neurosurgical procedures, or family history of seizures.  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  Brain MRI with and without contrast 01/2023 normal, hippocampi symmetric with no abnormal signal or enhancement seen  Routine wake and sleep EEG 11/2014 was normal.  1-hour wake and sleep EEG 11/2022 normal 70-hour ambulatory EEG 12/2022 normal, typical events not captured.  PAST MEDICAL HISTORY: Past Medical History:  Diagnosis Date   Anxiety    Seizures (HCC)     MEDICATIONS: Current Outpatient Medications on File Prior to Visit  Medication Sig Dispense Refill   acetaminophen (TYLENOL) 500 MG tablet Take 500 mg by mouth every 6 (six) hours as needed for headache.     levETIRAcetam (KEPPRA) 500 MG tablet Take 2 tablets in morning, and 3 tablets in evening 150 tablet 11   LORazepam (ATIVAN) 1 MG tablet Take 1 tablet once a day as needed for panic attacks. Do not take more than 3 a week 10 tablet 5   sertraline (ZOLOFT) 50 MG tablet Take 1 tablet (50 mg total) by mouth daily. 30 tablet 11   No current facility-administered medications on file prior to visit.    ALLERGIES: Allergies  Allergen Reactions   Amoxicillin     hives   Penicillins     hives    FAMILY HISTORY: Family History  Problem Relation Age of Onset   Scleritis Mother     SOCIAL HISTORY: Social History   Socioeconomic History   Marital status: Married    Spouse name: Not on file   Number of children: 1   Years of education: Not on file   Highest education level: Not on file  Occupational History   Occupation: Housekeeper  Tobacco Use   Smoking status: Every Day    Types: Cigarettes   Smokeless tobacco: Never   Tobacco comments:    Not a 1/2 pack a day just less than  Vaping Use   Vaping status: Never Used  Substance and Sexual Activity   Alcohol use: Not Currently    Comment: rarely   Drug use: No   Sexual activity: Not on file  Other Topics Concern    Not on file  Social History Narrative   Right handed   HS   Lives with husband and daughter in one story home   Caffeine 3 cup a day   Social Drivers of Corporate investment banker Strain: Not on File (07/15/2021)   Received from [facility], [facility]    Financial Resource Strain: 0  Food Insecurity: Not on File (01/22/2023)   Received from [facility] Insecurity    Food: 0  Transportation Needs: Not on File (07/15/2021)   Received from [facility], [facility] Needs    Transportation: 0  Physical Activity: Not on File (07/15/2021)   Received from [place], [place]   Physical Activity    Physical Activity: 0  Stress: Not on File (07/15/2021)   Received from [facility], [place]   Stress    Stress: 0  Social Connections: Not on File (01/10/2023)   Received from [facility]  Connectedness: 0  Intimate Partner Violence: Not on file     PHYSICAL EXAM: Vitals:   05/18/23 1524  BP: 111/77  Pulse: 73  SpO2: 99%   General: No acute distress Head:  Normocephalic/atraumatic Skin/Extremities: No rash, no edema Neurological Exam: alert and awake. No aphasia or dysarthria. Fund of knowledge is appropriate.  Attention and concentration are normal.   Cranial nerves: Pupils equal, round. Extraocular movements intact with no nystagmus. Visual fields full.  No facial asymmetry. Hard of hearing.  Motor: Bulk and tone normal, muscle strength 5/5 throughout with no pronator drift.   Finger to nose testing intact.  Gait slow and cautious reporting leg pain, no ataxia. Romberg negative.   IMPRESSION: This is a pleasant 53 yo RH woman with a history of panic attacks/anxiety, as well as episodes of staring/unresponsiveness, who had several GTCs preceded by staring/unresponsiveness and lip smacking, suggestive of temporal lobe epilepsy. No convulsions since 2018. She continued to report panic attacks. Repeat brain MRI 2024 normal, 70-hour ambulatory EEG normal. She  was having panic attacks all day then had an episode of staring and repetitive speech on 03/15/23 suggestive of focal impaired awareness seizure. We discussed adding Oxcarbazepine 150mg  BID to Levetiracetam 1000mg  in AM, 1500mg  in PM. Side effects Discussed. Continue Sertraline 50mg  daily for anxiety, she has prn lorazepam for seizure/anxiety. She does not drive. Continue symptom calendar, follow-up in 3 months, call for any changes.   Thank you for allowing me to participate in her care.  Please do not hesitate to call for any questions or concerns.    Patrcia Dolly, M.D.   CC: Claybon Jabs, NP

## 2023-05-18 NOTE — Patient Instructions (Signed)
Good to see you.  Start Oxcarbazepine (Trileptal) 150mg : Take 1 tablet twice a day  2. Continue Keppra 500mg : take 2 tablets in AM, 3 tablets in PM  3. Continue Sertraline and as needed Ativan  4. Continue calendar of symptoms, follow-up in 3-4 months, call for any changes   Seizure Precautions: 1. If medication has been prescribed for you to prevent seizures, take it exactly as directed.  Do not stop taking the medicine without talking to your doctor first, even if you have not had a seizure in a long time.   2. Avoid activities in which a seizure would cause danger to yourself or to others.  Don't operate dangerous machinery, swim alone, or climb in high or dangerous places, such as on ladders, roofs, or girders.  Do not drive unless your doctor says you may.  3. If you have any warning that you may have a seizure, lay down in a safe place where you can't hurt yourself.    4.  No driving for 6 months from last seizure, as per [facility].   Please refer to the following link on the Epilepsy Foundation of America's website for more information: http://www.epilepsyfoundation.org/answerplace/Social/driving/drivingu.cfm   5.  Maintain good sleep hygiene. Avoid alcohol  6.  Contact your doctor if you have any problems that may be related to the medicine you are taking.  7.  Call 911 and bring the patient back to the ED if:        A.  The seizure lasts longer than 5 minutes.       B.  The patient doesn't awaken shortly after the seizure  C.  The patient has new problems such as difficulty seeing, speaking or moving  D.  The patient was injured during the seizure  E.  The patient has a temperature over 102 F (39C)  F.  The patient vomited and now is having trouble breathing

## 2023-09-02 ENCOUNTER — Encounter: Payer: Self-pay | Admitting: Neurology

## 2023-09-02 ENCOUNTER — Ambulatory Visit (INDEPENDENT_AMBULATORY_CARE_PROVIDER_SITE_OTHER): Payer: BC Managed Care – PPO | Admitting: Neurology

## 2023-09-02 ENCOUNTER — Other Ambulatory Visit

## 2023-09-02 VITALS — BP 100/63 | HR 84 | Ht 66.0 in | Wt 122.8 lb

## 2023-09-02 DIAGNOSIS — F419 Anxiety disorder, unspecified: Secondary | ICD-10-CM

## 2023-09-02 DIAGNOSIS — G40209 Localization-related (focal) (partial) symptomatic epilepsy and epileptic syndromes with complex partial seizures, not intractable, without status epilepticus: Secondary | ICD-10-CM

## 2023-09-02 MED ORDER — LEVETIRACETAM 500 MG PO TABS
ORAL_TABLET | ORAL | 11 refills | Status: DC
Start: 2023-09-02 — End: 2023-12-23

## 2023-09-02 MED ORDER — OXCARBAZEPINE 300 MG PO TABS: 300.0000 mg | ORAL_TABLET | Freq: Two times a day (BID) | ORAL | 11 refills | Status: DC

## 2023-09-02 NOTE — Addendum Note (Signed)
 Addended by: Erica Hau on: 09/02/2023 02:05 PM   Modules accepted: Orders

## 2023-09-02 NOTE — Patient Instructions (Signed)
 Good to see you!  Have bloodwork done for CBC, CMP  2. Increase Oxcarbazepine  to 300mg  twice a day. With your current bottle of Oxcarbazepine  150mg : take 2 tablets twice a day. Once done, your new bottle will be Oxcarbazepine  300mg : take 1 tablet twice a day  3. Continue Levetiracetam  (Keppra ) 500mg : take 2 tablets in AM, 3 tablets in PM, Zoloft  50mg  daily, as needed lorazepam   4. Keep a calendar of the panic attacks  5. Follow-up in 3 months, call for any changes   Seizure Precautions: 1. If medication has been prescribed for you to prevent seizures, take it exactly as directed.  Do not stop taking the medicine without talking to your doctor first, even if you have not had a seizure in a long time.   2. Avoid activities in which a seizure would cause danger to yourself or to others.  Don't operate dangerous machinery, swim alone, or climb in high or dangerous places, such as on ladders, roofs, or girders.  Do not drive unless your doctor says you may.  3. If you have any warning that you may have a seizure, lay down in a safe place where you can't hurt yourself.    4.  No driving for 6 months from last seizure, as per [place]  state law.   Please refer to the following link on the Epilepsy Foundation of America's website for more information: http://www.epilepsyfoundation.org/answerplace/Social/driving/drivingu.cfm   5.  Maintain good sleep hygiene. Avoid alcohol.  6.  Contact your doctor if you have any problems that may be related to the medicine you are taking.  7.  Call 911 and bring the patient back to the ED if:        A.  The seizure lasts longer than 5 minutes.       B.  The patient doesn't awaken shortly after the seizure  C.  The patient has new problems such as difficulty seeing, speaking or moving  D.  The patient was injured during the seizure  E.  The patient has a temperature over 102 F (39C)  F.  The patient vomited and now is having trouble breathing

## 2023-09-02 NOTE — Progress Notes (Signed)
 NEUROLOGY FOLLOW UP OFFICE NOTE  Tracy Kelly [id number] 1970-05-02  HISTORY OF PRESENT ILLNESS: I had the pleasure of seeing Tracy Kelly in follow-up in the neurology clinic on 09/02/2023.  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 She is again accompanied by her husband who helps supplement the history today.  Records and images were personally reviewed where available.  On her last visit, they report an episode on 03/15/23 where she was having panic attacks all day then had an episode of staring and repetitive speech. Prior to this, she had been reporting frequent panic attacks and 72-hour EEG in 12/2022 was done, which was normal, however typical events were not captured. She is on Sertraline  50mg  daily and has prn lorazepam . Oxcarbazepine  150mg  BID was added to Levetiracetam  1000mg  in AM, 1500mg  in PM.   Her husband notes improvement in symptoms. No staring/repetitive speech since 02/2023. She has had 3 panic attacks in the past 3 months, the intensity has quieted down and 1/2 tablet of lorazepam  helps pretty quickly. The last occurred Easter Sunday and Monday, she took lorazepam  2 different times, she told him she felt like she was in a trance. She has been having more headaches, she had one yesterday. She states it hit her all of a sudden like a migraine, no nausea/vomiting. She took [facility] powder with good response. She has told him she feels dehydrated, she drinks a lot of water. No dizziness, vision changes, focal numbness/tingling/weakness, no falls. Sleep is okay. She reports soreness in both legs from working, she is very active at work.   History on Initial Assessment 03/16/2015: This is a pleasant 53 yo RH woman with new onset seizure last 12/25/14. She has no recollection of events, she recalls going to work that morning, then waking up in the hospital. She and her husband work together, and he reports that on the morning of 12/25/14, prior to  going to work, he noticed her standing at the door, staring and unresponsive for around 5 seconds. They went to work where she had a panic attack and another staring episode that she is amnestic of. They got home then later on he found her sitting on the couch unresponsive, then just saying "okay" when asked questions. She then stopped talking and started having lip smacking. He called 911, and when they arrived, she continued to be staring then proceeded to have a generalized tonic-clonic seizure was head turn to the right lasting 1-2 minutes. She had 3 more seizures en route and was given Versed. She was back to baseline in the ER and was started on Keppra . Bloodwork showed normal CBC except mild thrombocytopenia with platelets of 124, CMP unremarkable, UDS positive for benzodiazepines (received Versed in ambulance). 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 Routine wake and sleep EEG was normal.   Her husband reports that she has always had panic attacks since her 73s. He has also been noticing staring and unresponsive episodes for the past couple of months. She had tried Paxil for anxiety in her 53s, but felt unwell and woke up in the hospital. There have been no further seizures, staring episodes, as well as no further panic attacks/anxiety since starting Keppra  500mg  BID. 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   Epilepsy Risk Factors:  She  had a normal birth and early development.  There is no history of febrile convulsions, CNS infections such as meningitis/encephalitis, significant traumatic brain injury, neurosurgical procedures, or family history of seizures.  Diagnostic Data:  MRI brain with and without contrast 11/2015 was markedly oblique due to patient positioning, but appears that the left hippocampus is  slightly smaller than the right.  Brain MRI with and without contrast 01/2023 normal, hippocampi symmetric with no abnormal signal or enhancement seen  Routine wake and sleep EEG 11/2014 was normal.  1-hour wake and sleep EEG 11/2022 normal 70-hour ambulatory EEG 12/2022 normal, typical events not captured.  PAST MEDICAL HISTORY: Past Medical History:  Diagnosis Date   Anxiety    Seizures (HCC)     MEDICATIONS: Current Outpatient Medications on File Prior to Visit  Medication Sig Dispense Refill   acetaminophen  (TYLENOL ) 500 MG tablet Take 500 mg by mouth every 6 (six) hours as needed for headache.     levETIRAcetam  (KEPPRA ) 500 MG tablet Take 2 tablets in morning, and 3 tablets in evening 150 tablet 11   LORazepam  (ATIVAN ) 1 MG tablet Take 1 tablet once a day as needed for panic attacks. Do not take more than 3 a week 10 tablet 5   OXcarbazepine  (TRILEPTAL ) 150 MG tablet Take 1 tablet (150 mg total) by mouth 2 (two) times daily. 60 tablet 6   sertraline  (ZOLOFT ) 50 MG tablet Take 1 tablet (50 mg total) by mouth daily. 30 tablet 11   No current facility-administered medications on file prior to visit.    ALLERGIES: Allergies  Allergen Reactions   Amoxicillin     hives   Penicillins     hives    FAMILY HISTORY: Family History  Problem Relation Age of Onset   Scleritis Mother     SOCIAL HISTORY: Social History   Socioeconomic History   Marital status: Married    Spouse name: Not on file   Number of children: 1   Years of education: Not on file   Highest education level: Not on file  Occupational History   Occupation: Housekeeper  Tobacco Use   Smoking status: Every Day    Types: Cigarettes   Smokeless tobacco: Never   Tobacco comments:    Not a 1/2 pack a day just less than  Vaping Use   Vaping status: Never Used  Substance and Sexual Activity   Alcohol use: Not Currently    Comment: rarely   Drug use: No   Sexual activity: Not on file  Other Topics Concern    Not on file  Social History Narrative   Right handed   HS   Lives with husband and daughter in one story home   Caffeine 3 cup a day   Social Drivers of Corporate investment banker Strain: Not on File (07/15/2021)   Received from [facility], [facility]    Financial Resource Strain: 0  Food Insecurity: Not on File (01/22/2023)   Received from [facility] Insecurity    Food: 0  Transportation Needs: Not on File (07/15/2021)   Received from [facility], [facility] Needs    Transportation: 0  Physical Activity: Not on File (07/15/2021)   Received from [place], [place]   Physical Activity    Physical Activity: 0  Stress: Not on File (07/15/2021)   Received from [facility], [place]   Stress    Stress: 0  Social Connections: Not on File (01/10/2023)   Received  from [facility]   Social Connections    Connectedness: 0  Intimate Partner Violence: Not on file     PHYSICAL EXAM: Vitals:   09/02/23 1325  BP: 100/63  Pulse: 84  SpO2: 96%   General: No acute distress Head:  Normocephalic/atraumatic Skin/Extremities: No rash, no edema Neurological Exam: alert and awake. No aphasia or dysarthria. Fund of knowledge is appropriate.  Attention and concentration are normal.   Cranial nerves: Pupils equal, round. Extraocular movements intact with no nystagmus. Visual fields full.  No facial asymmetry.  Motor: Bulk and tone normal, muscle strength 5/5 throughout with no pronator drift.   Finger to nose testing intact.  Gait narrow-based and steady, able to tandem walk adequately.  Romberg negative.   IMPRESSION: This is a pleasant 53 yo RH woman with a history of panic attacks/anxiety, as well as episodes of staring/unresponsiveness, who had several GTCs preceded by staring/unresponsiveness and lip smacking, suggestive of temporal lobe epilepsy. No convulsions since 2018, no focal seizures with impaired awareness since 02/2023. She still has panic attacks but less intense with  addition of oxcarbazepine . We discussed increasing to 300mg  BID, continue Levetiracetam  1000mg  in AM, 1500mg  in PM. Check CBC, CMP. She has prn lorazepam  for panic attacks, taking 1/2 tablet as needed. Continue Sertraline  50mg  daily. She does not drive. Continue symptoms diary, follow-up in 3 months, call for any changes.   Thank you for allowing me to participate in her care.  Please do not hesitate to call for any questions or concerns.    Rayfield Cairo, M.D.   CC: Vincenzo Greenhouse, NP

## 2023-09-03 LAB — CBC WITH DIFFERENTIAL/PLATELET
Absolute Lymphocytes: 1556 {cells}/uL (ref 850–3900)
Absolute Monocytes: 323 {cells}/uL (ref 200–950)
Basophils Absolute: 31 {cells}/uL (ref 0–200)
Basophils Relative: 0.5 %
Eosinophils Absolute: 110 {cells}/uL (ref 15–500)
Eosinophils Relative: 1.8 %
HCT: 39 % (ref 35.0–45.0)
Hemoglobin: 12.9 g/dL (ref 11.7–15.5)
MCH: 31.1 pg (ref 27.0–33.0)
MCHC: 33.1 g/dL (ref 32.0–36.0)
MCV: 94 fL (ref 80.0–100.0)
MPV: 9.5 fL (ref 7.5–12.5)
Monocytes Relative: 5.3 %
Neutro Abs: 4081 {cells}/uL (ref 1500–7800)
Neutrophils Relative %: 66.9 %
Platelets: 150 10*3/uL (ref 140–400)
RBC: 4.15 10*6/uL (ref 3.80–5.10)
RDW: 12.9 % (ref 11.0–15.0)
Total Lymphocyte: 25.5 %
WBC: 6.1 10*3/uL (ref 3.8–10.8)

## 2023-09-03 LAB — COMPREHENSIVE METABOLIC PANEL WITH GFR
AG Ratio: 2.2 (calc) (ref 1.0–2.5)
ALT: 10 U/L (ref 6–29)
AST: 14 U/L (ref 10–35)
Albumin: 4.1 g/dL (ref 3.6–5.1)
Alkaline phosphatase (APISO): 65 U/L (ref 37–153)
BUN: 11 mg/dL (ref 7–25)
CO2: 30 mmol/L (ref 20–32)
Calcium: 9.3 mg/dL (ref 8.6–10.4)
Chloride: 106 mmol/L (ref 98–110)
Creat: 0.77 mg/dL (ref 0.50–1.03)
Globulin: 1.9 g/dL (ref 1.9–3.7)
Glucose, Bld: 88 mg/dL (ref 65–99)
Potassium: 3.9 mmol/L (ref 3.5–5.3)
Sodium: 141 mmol/L (ref 135–146)
Total Bilirubin: 0.4 mg/dL (ref 0.2–1.2)
Total Protein: 6 g/dL — ABNORMAL LOW (ref 6.1–8.1)
eGFR: 93 mL/min/{1.73_m2} (ref >=60)

## 2023-12-23 ENCOUNTER — Encounter: Payer: Self-pay | Admitting: Neurology

## 2023-12-23 ENCOUNTER — Ambulatory Visit (INDEPENDENT_AMBULATORY_CARE_PROVIDER_SITE_OTHER): Admitting: Neurology

## 2023-12-23 VITALS — BP 99/67 | HR 83 | Resp 20 | Ht 66.0 in | Wt 121.0 lb

## 2023-12-23 DIAGNOSIS — G40209 Localization-related (focal) (partial) symptomatic epilepsy and epileptic syndromes with complex partial seizures, not intractable, without status epilepticus: Secondary | ICD-10-CM | POA: Diagnosis not present

## 2023-12-23 DIAGNOSIS — F419 Anxiety disorder, unspecified: Secondary | ICD-10-CM | POA: Diagnosis not present

## 2023-12-23 MED ORDER — SERTRALINE HCL 100 MG PO TABS: 100.0000 mg | ORAL_TABLET | Freq: Every day | ORAL | 3 refills | Status: DC

## 2023-12-23 MED ORDER — LEVETIRACETAM 500 MG PO TABS: ORAL_TABLET | ORAL | 11 refills | Status: DC

## 2023-12-23 MED ORDER — OXCARBAZEPINE 300 MG PO TABS: 300.0000 mg | ORAL_TABLET | Freq: Two times a day (BID) | ORAL | 11 refills | Status: DC

## 2023-12-23 MED ORDER — LORAZEPAM 1 MG PO TABS: ORAL_TABLET | ORAL | 5 refills | Status: DC

## 2023-12-23 NOTE — Patient Instructions (Addendum)
 Good to see you.  Increase Zoloft  (Sertraline ) to 100mg : take 1 tablet every night  2. Continue Oxcarbazepine  300mg : take 1 tablet twice a day  3. Continue Levetiracetam  (Keppra ) 500mg : take 2 tablets in morning, 3 tablets in evening  4. Refills sent for as needed Ativan   5. Referral will be sent to [facility] for Psychiatry and therapy  6. Here is the information to set up a new primary care doctor: [email or web address]  Call [phone] to make an appointment  7. Follow-up in 4 months, call for any changes.    Seizure Precautions: 1. If medication has been prescribed for you to prevent seizures, take it exactly as directed.  Do not stop taking the medicine without talking to your doctor first, even if you have not had a seizure in a long time.   2. Avoid activities in which a seizure would cause danger to yourself or to others.  Don't operate dangerous machinery, swim alone, or climb in high or dangerous places, such as on ladders, roofs, or girders.  Do not drive unless your doctor says you may.  3. If you have any warning that you may have a seizure, lay down in a safe place where you can't hurt yourself.    4.  No driving for 6 months from last seizure, as per [place]  state law.   Please refer to the following link on the Epilepsy Foundation of America's website for more information: http://www.epilepsyfoundation.org/answerplace/Social/driving/drivingu.cfm   5.  Maintain good sleep hygiene. Avoid alcohol.  6.  Contact your doctor if you have any problems that may be related to the medicine you are taking.  7.  Call 911 and bring the patient back to the ED if:        A.  The seizure lasts longer than 5 minutes.       B.  The patient doesn't awaken shortly after the seizure  C.  The patient has new problems such as difficulty seeing, speaking or moving  D.  The patient was injured during the  seizure  E.  The patient has a temperature over 102 F (39C)  F.  The patient vomited and now is having trouble breathing

## 2023-12-23 NOTE — Progress Notes (Signed)
 NEUROLOGY FOLLOW UP OFFICE NOTE  Tracy Kelly [id number] Mar 03, 1971  HISTORY OF PRESENT ILLNESS: I had the pleasure of seeing Tracy Kelly in follow-up in the neurology clinic on 12/23/2023.  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 She is again accompanied by her husband who helps supplement the history today.  Records and images were personally reviewed where available.  On her last visit, they reported improvement in symptoms with addition of oxcarbazepine . Dose increased to 300mg  BID, she is also on Levetiracetam  1000mg  in AM, 1500mg  in PM. No episodes of staring/repetitive speech since 02/2023, no convulsions since 2018. Her husband reports she still has quite a few episodes of panic attacks. She had multiple on 5/25 and 7/7, took Ativan  and had to leave work early both times. She had really minors ones on 8/14,/8/15, and 8/16 that did not require Ativan , she recovered really quickly. He recalls she was overtired that time. She states the anxiety hits her all of a sudden, feeling like hot flashes like her BP going up. She has a strange feeling and hears a voice in her head that is kind of weird saying you're going to have a panic attack. She tries to shake it off. She feels overtired despite getting a full night rest, still requiring a nap. She snores occasionally, no apneic episodes. She reports a lot of stomach trouble. She has occasional sharp pain going from her left knee to her chest. Her husband notes she has a lot of problems at work, stressing her out. She has trouble interacting with people, not knowing how to handle issues when they come up. She is on Sertraline  50mg  daily without side effects.    History on Initial Assessment 03/16/2015: This is a pleasant 53 yo RH woman with new onset seizure last 12/25/14. She has no recollection of events, she recalls going to work that morning, then waking up in the hospital. She and her husband  work together, and he reports that on the morning of 12/25/14, prior to going to work, he noticed her standing at the door, staring and unresponsive for around 5 seconds. They went to work where she had a panic attack and another staring episode that she is amnestic of. They got home then later on he found her sitting on the couch unresponsive, then just saying okay when asked questions. She then stopped talking and started having lip smacking. He called 911, and when they arrived, she continued to be staring then proceeded to have a generalized tonic-clonic seizure was head turn to the right lasting 1-2 minutes. She had 3 more seizures en route and was given Versed. She was back to baseline in the ER and was started on Keppra . Bloodwork showed normal CBC except mild thrombocytopenia with platelets of 124, CMP unremarkable, UDS positive for benzodiazepines (received Versed in ambulance). 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 Routine wake and sleep EEG was normal.   Her husband reports that she has always had panic attacks since her 36s. He has also been noticing staring and unresponsive episodes for the past couple of months. She had tried Paxil for anxiety in her 33s, but felt unwell and woke up in the hospital. There have been no further seizures, staring episodes, as well as no further panic attacks/anxiety since starting Keppra  500mg  BID. 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   Epilepsy Risk Factors:  She had a normal birth and early development.  There is no history of febrile convulsions, CNS infections such as meningitis/encephalitis, significant traumatic brain injury, neurosurgical procedures, or family history of seizures.  Diagnostic Data:  MRI brain with and without contrast 11/2015 was markedly  oblique due to patient positioning, but appears that the left hippocampus is slightly smaller than the right.  Brain MRI with and without contrast 01/2023 normal, hippocampi symmetric with no abnormal signal or enhancement seen  Routine wake and sleep EEG 11/2014 was normal.  1-hour wake and sleep EEG 11/2022 normal 70-hour ambulatory EEG 12/2022 normal, typical events not captured.  PAST MEDICAL HISTORY: Past Medical History:  Diagnosis Date   Anxiety    Seizures (HCC)     MEDICATIONS: Current Outpatient Medications on File Prior to Visit  Medication Sig Dispense Refill   acetaminophen  (TYLENOL ) 500 MG tablet Take 500 mg by mouth every 6 (six) hours as needed for headache.     levETIRAcetam  (KEPPRA ) 500 MG tablet Take 2 tablets in morning, and 3 tablets in evening 150 tablet 11   LORazepam  (ATIVAN ) 1 MG tablet Take 1 tablet once a day as needed for panic attacks. Do not take more than 3 a week 10 tablet 5   Oxcarbazepine  (TRILEPTAL ) 300 MG tablet Take 1 tablet (300 mg total) by mouth 2 (two) times daily. 60 tablet 11   sertraline  (ZOLOFT ) 50 MG tablet Take 1 tablet (50 mg total) by mouth daily. 30 tablet 11   No current facility-administered medications on file prior to visit.    ALLERGIES: Allergies  Allergen Reactions   Amoxicillin     hives   Penicillins     hives    FAMILY HISTORY: Family History  Problem Relation Age of Onset   Scleritis Mother     SOCIAL HISTORY: Social History   Socioeconomic History   Marital status: Married    Spouse name: Not on file   Number of children: 1   Years of education: Not on file   Highest education level: Not on file  Occupational History   Occupation: Housekeeper  Tobacco Use   Smoking status: Every Day    Types: Cigarettes   Smokeless tobacco: Never   Tobacco comments:    Not a 1/2 pack a day just less than  Vaping Use   Vaping status: Never Used  Substance and Sexual Activity   Alcohol use: Not Currently    Comment:  rarely   Drug use: No   Sexual activity: Not on file  Other Topics Concern   Not on file  Social History Narrative   Right handed   HS   Lives with husband and daughter in one story home   Caffeine 3 cup a day   Social Drivers of Health   Financial Resource Strain: Not on File (07/15/2021)   Received from [employer] [facility]    Financial Resource Strain: 0  Food Insecurity: Not on File (01/22/2023)   Received from [facility] Insecurity    Food: 0  Transportation Needs: Not on File (07/15/2021)   Received from [facility] Needs    Transportation: 0  Physical Activity: Not on File (07/15/2021)   Received from [facility]   Physical Activity    Physical Activity: 0  Stress: Not on File (07/15/2021)   Received from [facility]   Stress  Stress: 0  Social Connections: Not on File (01/10/2023)   Received from [facility]   Social Connections    Connectedness: 0  Intimate Partner Violence: Not on file     PHYSICAL EXAM: Vitals:   12/23/23 1124  BP: 99/67  Pulse: 83  Resp: 20  SpO2: 97%   General: No acute distress Head:  Normocephalic/atraumatic Skin/Extremities: No rash, no edema Neurological Exam: alert and awake. No aphasia or dysarthria. Fund of knowledge is appropriate. Attention and concentration are normal.   Cranial nerves: Pupils equal, round. Extraocular movements intact with no nystagmus. Visual fields full.  No facial asymmetry. Hard of hearing.  Motor: Bulk and tone normal, muscle strength 5/5 throughout with no pronator drift.   Finger to nose testing intact.  Gait narrow-based and steady, able to tandem walk adequately.  Romberg negative.   IMPRESSION: This is a pleasant 53 yo RH woman with a history of panic attacks/anxiety, as well as episodes of staring/unresponsiveness, who had several GTCs preceded by staring/unresponsiveness and lip smacking, suggestive of temporal lobe epilepsy. No convulsions since 2018, her husband denies any focal  seizures with impaired awareness since 02/2023. She continues to have panic attacks, initially it appeared to improve with addition of oxcarbazepine , however her husband states she still has quite a few. Increase Sertraline  to 100mg  at bedtime. Continue Levetiracetam  1000mg  in AM, 1500mg  in PM and Oxcarbazepine  300mg  BID. She has prn Ativan  for seizure rescue and anxiety. She agrees to referral to [facility] for psychiatry and psychotherapy evaluation and management of panic attacks/anxiety. She does not drive. Continue symptom calendar, follow-up in 4 months, call for any changes.   Thank you for allowing me to participate in her care.  Please do not hesitate to call for any questions or concerns.    Darice Shivers, M.D.   CC: Damien Daring, NP

## 2024-04-20 ENCOUNTER — Ambulatory Visit: Admitting: Neurology

## 2024-04-20 ENCOUNTER — Encounter: Payer: Self-pay | Admitting: Neurology

## 2024-04-20 VITALS — BP 96/67 | HR 81 | Ht 66.0 in | Wt 116.0 lb

## 2024-04-20 DIAGNOSIS — G40209 Localization-related (focal) (partial) symptomatic epilepsy and epileptic syndromes with complex partial seizures, not intractable, without status epilepticus: Secondary | ICD-10-CM | POA: Diagnosis not present

## 2024-04-20 DIAGNOSIS — F419 Anxiety disorder, unspecified: Secondary | ICD-10-CM

## 2024-04-20 MED ORDER — LEVETIRACETAM 500 MG PO TABS: ORAL_TABLET | ORAL | 11 refills | Status: AC

## 2024-04-20 MED ORDER — LORAZEPAM 1 MG PO TABS: ORAL_TABLET | ORAL | 5 refills | Status: AC

## 2024-04-20 MED ORDER — SERTRALINE HCL 100 MG PO TABS: 100.0000 mg | ORAL_TABLET | Freq: Every day | ORAL | 6 refills | Status: AC

## 2024-04-20 MED ORDER — OXCARBAZEPINE 600 MG PO TABS: 600.0000 mg | ORAL_TABLET | Freq: Two times a day (BID) | ORAL | 6 refills | Status: AC

## 2024-04-20 NOTE — Progress Notes (Unsigned)
 "  NEUROLOGY FOLLOW UP OFFICE NOTE  Tracy Kelly [id number] 1970/08/01  HISTORY OF PRESENT ILLNESS: I had the pleasure of seeing Tracy Kelly in follow-up in the neurology clinic on 04/20/2024.  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 She is again accompanied by her husband who helps supplement the history today.  Records and images were personally reviewed where available.  On her last visit, they continued to report a lot of anxiety, Sertraline  increased to 100mg  daily. She is on Levetiracetam  1000mg  in AM, 1500mg  in PM and Oxcarbazepine  300mg  BID for seizure prophylaxis. She has prn Ativan  for seizure rescue and anxiety.   04/18/24 Last Sept and late Nov, 2 3-day events of panic attacks (used Ativan  in Nov -missed work 11/30) Missed 4-5 days of work due to panic attacks  I had the pleasure of seeing Tracy Kelly in follow-up in the neurology clinic on 12/23/2023.  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 She is again accompanied by her husband who helps supplement the history today.  Records and images were personally reviewed where available.  On her last visit, they reported improvement in symptoms with addition of oxcarbazepine . Dose increased to 300mg  BID, she is also on Levetiracetam  1000mg  in AM, 1500mg  in PM. No episodes of staring/repetitive speech since 02/2023, no convulsions since 2018. Her husband reports she still has quite a few episodes of panic attacks. She had multiple on 5/25 and 7/7, took Ativan  and had to leave work early both times. She had really minors ones on 8/14,/8/15, and 8/16 that did not require Ativan , she recovered really quickly. He recalls she was overtired that time. She states the anxiety hits her all of a sudden, feeling like hot flashes like her BP going up. She has a strange feeling and hears a voice in her head that is kind of weird saying you're  going to have a panic attack. She tries to shake it off. She feels overtired despite getting a full night rest, still requiring a nap. She snores occasionally, no apneic episodes. She reports a lot of stomach trouble. She has occasional sharp pain going from her left knee to her chest. Her husband notes she has a lot of problems at work, stressing her out. She has trouble interacting with people, not knowing how to handle issues when they come up. She is on Sertraline  50mg  daily without side effects.    History on Initial Assessment 03/16/2015: This is a pleasant 53 yo RH woman with new onset seizure last 12/25/14. She has no recollection of events, she recalls going to work that morning, then waking up in the hospital. She and her husband work together, and he reports that on the morning of 12/25/14, prior to going to work, he noticed her standing at the door, staring and unresponsive for around 5 seconds. They went to work where she had a panic attack and another staring episode that she is amnestic of. They got home then later on he found her sitting on the couch unresponsive, then just saying okay when asked questions. She then stopped talking and started having lip smacking. He called 911, and when they arrived, she continued to be staring then proceeded to have a generalized tonic-clonic seizure was head turn to the right lasting 1-2 minutes. She had 3 more seizures en route and was given Versed. She was back to baseline in the ER  and was started on Keppra . Bloodwork showed normal CBC except mild thrombocytopenia with platelets of 124, CMP unremarkable, UDS positive for benzodiazepines (received Versed in ambulance). 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 Routine wake and sleep EEG was normal.   Her husband reports that she has always had panic attacks since  her 97s. He has also been noticing staring and unresponsive episodes for the past couple of months. She had tried Paxil for anxiety in her 51s, but felt unwell and woke up in the hospital. There have been no further seizures, staring episodes, as well as no further panic attacks/anxiety since starting Keppra  500mg  BID. 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   Epilepsy Risk Factors:  She had a normal birth and early development.  There is no history of febrile convulsions, CNS infections such as meningitis/encephalitis, significant traumatic brain injury, neurosurgical procedures, or family history of seizures.  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  Brain MRI with and without contrast 01/2023 normal, hippocampi symmetric with no abnormal signal or enhancement seen  Routine wake and sleep EEG 11/2014 was normal.  1-hour wake and sleep EEG 11/2022 normal 70-hour ambulatory EEG 12/2022 normal, typical events not captured.   PAST MEDICAL HISTORY: Past Medical History:  Diagnosis Date   Anxiety    Seizures (HCC)     MEDICATIONS: Medications Ordered Prior to Encounter[1]  ALLERGIES: Allergies[2]  FAMILY HISTORY: Family History  Problem Relation Age of Onset   Scleritis Mother     SOCIAL HISTORY: Social History   Socioeconomic History   Marital status: Married    Spouse name: Not on file   Number of children: 1   Years of education: Not on file   Highest education level: Not on file  Occupational History   Occupation: Housekeeper  Tobacco Use   Smoking status: Every Day    Types: Cigarettes   Smokeless tobacco: Never   Tobacco comments:    Not a 1/2 pack a day just less than  Vaping Use   Vaping status: Never Used  Substance and Sexual Activity   Alcohol use: Not Currently     Comment: rarely   Drug use: No   Sexual activity: Not on file  Other Topics Concern   Not on file  Social History Narrative   Right handed   HS   Lives with husband and daughter in one story home   Caffeine 3 cup a day   Social Drivers of Health   Tobacco Use: High Risk (04/20/2024)   Patient History    Smoking Tobacco Use: Every Day    Smokeless Tobacco Use: Never    Passive Exposure: Not on file  Financial Resource Strain: Not on File (07/15/2021)   Received from [facility]    Financial Resource Strain: 0  Food Insecurity: Not on File (01/22/2023)   Received from [facility] Insecurity    Food: 0  Transportation Needs: Not on File (07/15/2021)   Received from [employer] Needs    Transportation: 0  Physical Activity: Not on File (07/15/2021)   Received from [facility]   Physical Activity    Physical Activity: 0  Stress: Not  on File (07/15/2021)   Received from [facility]   Stress    Stress: 0  Social Connections: Not on File (01/10/2023)   Received from [facility]   Social Connections    Connectedness: 0  Intimate Partner Violence: Not on file  Depression (PHQ2-9): Not on file  Alcohol Screen: Not on file  Housing: Not on file  Utilities: Not on file  Health Literacy: Not on file     PHYSICAL EXAM: Vitals:   04/20/24 1119  BP: 96/67  Pulse: 81  SpO2: 95%   General: No acute distress Head:  Normocephalic/atraumatic Skin/Extremities: No rash, no edema Neurological Exam: alert and oriented to person, place, and time. No aphasia or dysarthria. Fund of knowledge is appropriate.  Recent and remote memory are intact.  Attention and concentration are normal.   Cranial nerves: Pupils equal, round. Extraocular movements intact with no nystagmus. Visual fields full.  No facial asymmetry.  Motor: Bulk and tone normal, muscle strength 5/5 throughout with no pronator drift.   Finger to nose testing intact.  Gait narrow-based and steady, able to tandem  walk adequately.  Romberg negative.   IMPRESSION: ***   Thank you for allowing me to participate in *** care.  Please do not hesitate to call for any questions or concerns.  The duration of this appointment visit was *** minutes of face-to-face time with the patient.  Greater than 50% of this time was spent in counseling, explanation of diagnosis, planning of further management, and coordination of care.   Tracy Kelly, M.D.   CC: ***     PAST MEDICAL HISTORY: Past Medical History:  Diagnosis Date   Anxiety    Seizures [facility])     MEDICATIONS: Current Outpatient Medications on File Prior to Visit  Medication Sig Dispense Refill   acetaminophen  (TYLENOL ) 500 MG tablet Take 500 mg by mouth every 6 (six) hours as needed for headache.     levETIRAcetam  (KEPPRA ) 500 MG tablet Take 2 tablets in morning, and 3 tablets in evening 150 tablet 11   LORazepam  (ATIVAN ) 1 MG tablet Take 1 tablet once a day as needed for panic attacks. Do not take more than 3 a week 10 tablet 5   Oxcarbazepine  (TRILEPTAL ) 300 MG tablet Take 1 tablet (300 mg total) by mouth 2 (two) times daily. 60 tablet 11   sertraline  (ZOLOFT ) 100 MG tablet Take 1 tablet (100 mg total) by mouth at bedtime. 90 tablet 3   No current facility-administered medications on file prior to visit.    ALLERGIES: Allergies  Allergen Reactions   Amoxicillin     hives   Penicillins     hives    FAMILY HISTORY: Family History  Problem Relation Age of Onset   Scleritis Mother     SOCIAL HISTORY: Social History   Socioeconomic History   Marital status: Married    Spouse name: Not on file   Number of children: 1   Years of education: Not on file   Highest education level: Not on file  Occupational History   Occupation: Housekeeper  Tobacco Use   Smoking status: Every Day    Types: Cigarettes   Smokeless tobacco: Never   Tobacco comments:    Not a 1/2 pack a day just less than  Vaping Use   Vaping status: Never  Used  Substance and Sexual Activity   Alcohol use: Not Currently    Comment: rarely   Drug use: No   Sexual activity: Not on file  Other Topics Concern   Not on file  Social History Narrative   Right handed   HS   Lives with husband and daughter in one story home   Caffeine 3 cup a day   Social Drivers of Health   Tobacco Use: High Risk (04/20/2024)   Patient History    Smoking Tobacco Use: Every Day    Smokeless Tobacco Use: Never    Passive Exposure: Not on file  Financial Resource Strain: Not on File (07/15/2021)   Received from [facility]    Financial Resource Strain: 0  Food Insecurity: Not on File (01/22/2023)   Received from [facility] Insecurity    Food: 0  Transportation Needs: Not on File (07/15/2021)   Received from [employer] Needs    Transportation: 0  Physical Activity: Not on File (07/15/2021)   Received from [facility]   Physical Activity    Physical Activity: 0  Stress: Not on File (07/15/2021)   Received from [facility]   Stress    Stress: 0  Social Connections: Not on File (01/10/2023)   Received from [facility]   Social Connections    Connectedness: 0  Intimate Partner Violence: Not on file  Depression (PHQ2-9): Not on file  Alcohol Screen: Not on file  Housing: Not on file  Utilities: Not on file  Health Literacy: Not on file     PHYSICAL EXAM: Vitals:   04/20/24 1119  BP: 96/67  Pulse: 81  SpO2: 95%   General: No acute distress Head:  Normocephalic/atraumatic Skin/Extremities: No rash, no edema Neurological Exam: alert and awake. No aphasia or dysarthria. Fund of knowledge is appropriate. Attention and concentration are normal.   Cranial nerves: Pupils equal, round. Extraocular movements intact with no nystagmus. Visual fields full.  No facial asymmetry. Hard of hearing.  Motor: Bulk and tone normal, muscle strength 5/5 throughout with no pronator drift.   Finger to nose testing intact.  Gait narrow-based and  steady, able to tandem walk adequately.  Romberg negative.   IMPRESSION: This is a pleasant 53 yo RH woman with a history of panic attacks/anxiety, as well as episodes of staring/unresponsiveness, who had several GTCs preceded by staring/unresponsiveness and lip smacking, suggestive of temporal lobe epilepsy. No convulsions since 2018, her husband denies any focal seizures with impaired awareness since 02/2023. She continues to have panic attacks, initially it appeared to improve with addition of oxcarbazepine , however her husband states she still has quite a few. Increase Sertraline  to 100mg  at bedtime. Continue Levetiracetam  1000mg  in AM, 1500mg  in PM and Oxcarbazepine  300mg  BID. She has prn Ativan  for seizure rescue and anxiety. She agrees to referral to [facility] for psychiatry and psychotherapy evaluation and management of panic attacks/anxiety. She does not drive. Continue symptom calendar, follow-up in 4 months, call for any changes.   Thank you for allowing me to participate in her care.  Please do not hesitate to call for any questions or concerns.    Tracy Kelly, M.D.   CC: Damien Daring, NP      [1]  Current Outpatient Medications on File Prior to Visit  Medication Sig Dispense Refill   acetaminophen  (TYLENOL ) 500 MG tablet Take 500 mg by mouth every 6 (six) hours as needed for headache.     levETIRAcetam  (KEPPRA ) 500 MG tablet Take 2 tablets in morning, and 3 tablets in evening 150 tablet 11   LORazepam  (ATIVAN ) 1 MG tablet Take 1 tablet once a day  as needed for panic attacks. Do not take more than 3 a week 10 tablet 5   Oxcarbazepine  (TRILEPTAL ) 300 MG tablet Take 1 tablet (300 mg total) by mouth 2 (two) times daily. 60 tablet 11   sertraline  (ZOLOFT ) 100 MG tablet Take 1 tablet (100 mg total) by mouth at bedtime. 90 tablet 3   No current facility-administered medications on file prior to visit.  [2]  Allergies Allergen Reactions   Amoxicillin     hives    Penicillins     hives   "

## 2024-04-20 NOTE — Patient Instructions (Addendum)
 Good to see you.  Schedule inpatient video EEG study  2. If similar episode recurs, please try to take a video. Try giving the Ativan  to see if it helps.  3. Increase Oxcarbazepine  to 600mg  twice a day. With your current bottle of 300mg  tablets: take 2 tablets twice a day. Once done, your new bottle will be for 600mg : take 1 tablet twice a day  4. Continue Keppra  500mg : take 2 tablets every morning, 3 tablets every night  5. Referral will be sent to [facility], please schedule appointment with psychiatrist and therapist  6. Follow-up in 3 months, call for any changes   Seizure Precautions: 1. If medication has been prescribed for you to prevent seizures, take it exactly as directed.  Do not stop taking the medicine without talking to your doctor first, even if you have not had a seizure in a long time.   2. Avoid activities in which a seizure would cause danger to yourself or to others.  Don't operate dangerous machinery, swim alone, or climb in high or dangerous places, such as on ladders, roofs, or girders.  Do not drive unless your doctor says you may.  3. If you have any warning that you may have a seizure, lay down in a safe place where you can't hurt yourself.    4.  No driving for 6 months from last seizure, as per [place]  state law.   Please refer to the following link on the [employer]'s website for more information: http://www.epilepsyfoundation.org/answerplace/Social/driving/drivingu.cfm   5.  Maintain good sleep hygiene.   6.  Contact your doctor if you have any problems that may be related to the medicine you are taking.  7.  Call 911 and bring the patient back to the ED if:        A.  The seizure lasts longer than 5 minutes.       B.  The patient doesn't awaken shortly after the seizure  C.  The patient has new problems such as difficulty seeing, speaking or moving  D.  The patient was injured during the seizure  E.  The patient has a  temperature over 102 F (39C)  F.  The patient vomited and now is having trouble breathing

## 2024-05-04 ENCOUNTER — Encounter: Payer: Self-pay | Admitting: Neurology

## 2024-07-12 ENCOUNTER — Ambulatory Visit: Admitting: Neurology

## 2024-07-25 ENCOUNTER — Ambulatory Visit: Payer: Self-pay | Admitting: Neurology
